# Patient Record
Sex: Female | Born: 1970
Health system: Southern US, Community
[De-identification: ages and names within clinical notes are randomized; demographics above are authoritative.]

## PROBLEM LIST (undated history)

## (undated) DIAGNOSIS — K5909 Other constipation: Secondary | ICD-10-CM

## (undated) DIAGNOSIS — E119 Type 2 diabetes mellitus without complications: Secondary | ICD-10-CM

## (undated) DIAGNOSIS — I10 Essential (primary) hypertension: Secondary | ICD-10-CM

## (undated) DIAGNOSIS — K219 Gastro-esophageal reflux disease without esophagitis: Secondary | ICD-10-CM

## (undated) DIAGNOSIS — K2970 Gastritis, unspecified, without bleeding: Secondary | ICD-10-CM

## (undated) DIAGNOSIS — F329 Major depressive disorder, single episode, unspecified: Secondary | ICD-10-CM

## (undated) DIAGNOSIS — F32A Depression, unspecified: Secondary | ICD-10-CM

---

## 2006-03-14 ENCOUNTER — Ambulatory Visit: Payer: Self-pay | Admitting: Family Medicine

## 2006-04-07 ENCOUNTER — Ambulatory Visit: Payer: Self-pay | Admitting: Family Medicine

## 2006-05-10 ENCOUNTER — Ambulatory Visit: Payer: Self-pay | Admitting: Family Medicine

## 2006-05-17 ENCOUNTER — Ambulatory Visit: Payer: Self-pay | Admitting: Family Medicine

## 2011-11-03 ENCOUNTER — Emergency Department: Payer: Self-pay | Admitting: Emergency Medicine

## 2012-06-29 DIAGNOSIS — F209 Schizophrenia, unspecified: Secondary | ICD-10-CM | POA: Insufficient documentation

## 2012-06-29 DIAGNOSIS — I10 Essential (primary) hypertension: Secondary | ICD-10-CM | POA: Insufficient documentation

## 2012-06-29 DIAGNOSIS — H903 Sensorineural hearing loss, bilateral: Secondary | ICD-10-CM | POA: Insufficient documentation

## 2012-09-29 ENCOUNTER — Ambulatory Visit: Payer: Self-pay | Admitting: Registered Nurse

## 2012-10-12 ENCOUNTER — Ambulatory Visit: Payer: Self-pay | Admitting: Registered Nurse

## 2013-07-24 ENCOUNTER — Ambulatory Visit: Payer: Self-pay | Admitting: Internal Medicine

## 2013-10-18 ENCOUNTER — Ambulatory Visit: Payer: Self-pay | Admitting: Nurse Practitioner

## 2013-10-23 DIAGNOSIS — R059 Cough, unspecified: Secondary | ICD-10-CM | POA: Insufficient documentation

## 2013-10-23 DIAGNOSIS — R05 Cough: Secondary | ICD-10-CM | POA: Insufficient documentation

## 2013-10-23 DIAGNOSIS — Z720 Tobacco use: Secondary | ICD-10-CM | POA: Insufficient documentation

## 2013-10-23 HISTORY — DX: Cough, unspecified: R05.9

## 2014-02-21 ENCOUNTER — Other Ambulatory Visit (HOSPITAL_COMMUNITY): Payer: Self-pay | Admitting: Internal Medicine

## 2014-02-21 DIAGNOSIS — Z1231 Encounter for screening mammogram for malignant neoplasm of breast: Secondary | ICD-10-CM

## 2014-03-04 ENCOUNTER — Ambulatory Visit (HOSPITAL_COMMUNITY): Payer: Self-pay

## 2014-05-17 DIAGNOSIS — H9193 Unspecified hearing loss, bilateral: Secondary | ICD-10-CM | POA: Insufficient documentation

## 2014-05-17 DIAGNOSIS — E119 Type 2 diabetes mellitus without complications: Secondary | ICD-10-CM | POA: Insufficient documentation

## 2014-05-17 DIAGNOSIS — E1165 Type 2 diabetes mellitus with hyperglycemia: Secondary | ICD-10-CM | POA: Insufficient documentation

## 2014-05-17 DIAGNOSIS — Z794 Long term (current) use of insulin: Secondary | ICD-10-CM | POA: Insufficient documentation

## 2014-06-25 ENCOUNTER — Ambulatory Visit: Payer: Self-pay | Admitting: Gastroenterology

## 2014-08-05 LAB — SURGICAL PATHOLOGY

## 2014-09-05 ENCOUNTER — Encounter: Payer: Self-pay | Admitting: *Deleted

## 2014-09-06 ENCOUNTER — Encounter
Admission: RE | Disposition: A | Discharge status: Home / Self Care | Payer: Self-pay | Source: Ambulatory Visit | Attending: Gastroenterology

## 2014-09-06 ENCOUNTER — Ambulatory Visit: Payer: Medicare Other | Admitting: Anesthesiology

## 2014-09-06 ENCOUNTER — Ambulatory Visit
Admission: RE | Admit: 2014-09-06 | Discharge: 2014-09-06 | Disposition: A | Discharge status: Home / Self Care | Payer: Medicare Other | Source: Ambulatory Visit | Attending: Gastroenterology | Admitting: Gastroenterology

## 2014-09-06 DIAGNOSIS — K5900 Constipation, unspecified: Secondary | ICD-10-CM | POA: Insufficient documentation

## 2014-09-06 DIAGNOSIS — K625 Hemorrhage of anus and rectum: Secondary | ICD-10-CM | POA: Insufficient documentation

## 2014-09-06 DIAGNOSIS — E119 Type 2 diabetes mellitus without complications: Secondary | ICD-10-CM | POA: Diagnosis not present

## 2014-09-06 DIAGNOSIS — F17210 Nicotine dependence, cigarettes, uncomplicated: Secondary | ICD-10-CM | POA: Diagnosis not present

## 2014-09-06 DIAGNOSIS — Z79899 Other long term (current) drug therapy: Secondary | ICD-10-CM | POA: Insufficient documentation

## 2014-09-06 DIAGNOSIS — I10 Essential (primary) hypertension: Secondary | ICD-10-CM | POA: Diagnosis not present

## 2014-09-06 DIAGNOSIS — F329 Major depressive disorder, single episode, unspecified: Secondary | ICD-10-CM | POA: Insufficient documentation

## 2014-09-06 DIAGNOSIS — K219 Gastro-esophageal reflux disease without esophagitis: Secondary | ICD-10-CM | POA: Insufficient documentation

## 2014-09-06 HISTORY — DX: Major depressive disorder, single episode, unspecified: F32.9

## 2014-09-06 HISTORY — DX: Essential (primary) hypertension: I10

## 2014-09-06 HISTORY — DX: Gastro-esophageal reflux disease without esophagitis: K21.9

## 2014-09-06 HISTORY — DX: Gastritis, unspecified, without bleeding: K29.70

## 2014-09-06 HISTORY — DX: Depression, unspecified: F32.A

## 2014-09-06 HISTORY — PX: COLONOSCOPY: SHX5424

## 2014-09-06 HISTORY — DX: Type 2 diabetes mellitus without complications: E11.9

## 2014-09-06 HISTORY — DX: Other constipation: K59.09

## 2014-09-06 LAB — GLUCOSE, CAPILLARY: Glucose-Capillary: 133 mg/dL — ABNORMAL HIGH (ref 65–99)

## 2014-09-06 LAB — POCT PREGNANCY, URINE: Preg Test, Ur: NEGATIVE

## 2014-09-06 SURGERY — COLONOSCOPY
Anesthesia: General

## 2014-09-06 MED ORDER — PROPOFOL INFUSION 10 MG/ML OPTIME
INTRAVENOUS | Status: DC | PRN
  Administered 2014-09-06: 140 ug/kg/min via INTRAVENOUS

## 2014-09-06 MED ORDER — MIDAZOLAM HCL 5 MG/5ML IJ SOLN
INTRAMUSCULAR | Status: DC | PRN
  Administered 2014-09-06: 2 mg via INTRAVENOUS

## 2014-09-06 MED ORDER — PROPOFOL 10 MG/ML IV BOLUS
INTRAVENOUS | Status: DC | PRN
  Administered 2014-09-06: 70 mg via INTRAVENOUS

## 2014-09-06 MED ORDER — FENTANYL CITRATE (PF) 100 MCG/2ML IJ SOLN
INTRAMUSCULAR | Status: DC | PRN
  Administered 2014-09-06: 50 ug via INTRAVENOUS

## 2014-09-06 MED ORDER — SODIUM CHLORIDE 0.9 % IV SOLN
INTRAVENOUS | Status: DC
  Administered 2014-09-06: 09:00:00 via INTRAVENOUS
  Administered 2014-09-06: 1000 mL via INTRAVENOUS

## 2014-09-06 NOTE — Anesthesia Preprocedure Evaluation (Signed)
Anesthesia Evaluation  Patient identified by MRN, date of birth, ID band Patient awake    Reviewed: Allergy & Precautions, H&P , NPO status , Patient's Chart, lab work & pertinent test results, reviewed documented beta blocker date and time   Airway Mallampati: II  TM Distance: >3 FB Neck ROM: full    Dental no notable dental hx.    Pulmonary neg pulmonary ROS, Current Smoker,  breath sounds clear to auscultation  Pulmonary exam normal       Cardiovascular Exercise Tolerance: Good hypertension, negative cardio ROS  Rhythm:regular Rate:Normal     Neuro/Psych PSYCHIATRIC DISORDERS negative neurological ROS  negative psych ROS   GI/Hepatic negative GI ROS, Neg liver ROS, GERD-  ,  Endo/Other  negative endocrine ROSdiabetes  Renal/GU negative Renal ROS  negative genitourinary   Musculoskeletal   Abdominal   Peds  Hematology negative hematology ROS (+)   Anesthesia Other Findings   Reproductive/Obstetrics negative OB ROS                             Anesthesia Physical Anesthesia Plan  ASA: III  Anesthesia Plan: General   Post-op Pain Management:    Induction:   Airway Management Planned:   Additional Equipment:   Intra-op Plan:   Post-operative Plan:   Informed Consent: I have reviewed the patients History and Physical, chart, labs and discussed the procedure including the risks, benefits and alternatives for the proposed anesthesia with the patient or authorized representative who has indicated his/her understanding and acceptance.   Dental Advisory Given  Plan Discussed with: CRNA  Anesthesia Plan Comments:         Anesthesia Quick Evaluation

## 2014-09-06 NOTE — H&P (Signed)
Primary Care Physician:  [name], NP  Pre-Procedure History & Physical: HPI:  Traci Pena is a 44 y.o. female is here for an colonoscopy.   Past Medical History  Diagnosis Date  . Hypertension   . Diabetes mellitus without complication   . Gastritis   . Depression   . GERD (gastroesophageal reflux disease)   . Chronic constipation     History reviewed. No pertinent past surgical history.  Prior to Admission medications   Medication Sig Start Date End Date Taking? Authorizing Provider  albuterol (PROVENTIL HFA;VENTOLIN HFA) 108 (90 BASE) MCG/ACT inhaler Inhale 2 puffs into the lungs every 6 (six) hours as needed for wheezing or shortness of breath.   Yes Historical Provider, MD  aspirin EC 81 MG tablet Take 81 mg by mouth daily.   Yes Historical Provider, MD  atenolol (TENORMIN) 25 MG tablet Take by mouth daily.   Yes Historical Provider, MD  budesonide-formoterol (SYMBICORT) 160-4.5 MCG/ACT inhaler Inhale 2 puffs into the lungs 2 (two) times daily.   Yes Historical Provider, MD  cetirizine (ZYRTEC) 10 MG tablet Take 10 mg by mouth daily.   Yes Historical Provider, MD  cloZAPine (CLOZARIL) 100 MG tablet Take 100 mg by mouth daily.   Yes Historical Provider, MD  fluticasone (FLONASE) 50 MCG/ACT nasal spray Place 1 spray into both nostrils daily.   Yes Historical Provider, MD  Insulin Glargine 300 UNIT/ML SOPN Take 1.5 mLs by mouth at bedtime.   Yes Historical Provider, MD  insulin lispro (HUMALOG) 100 UNIT/ML injection Inject into the skin 3 (three) times daily before meals.   Yes Historical Provider, MD  iron polysaccharides (NIFEREX) 150 MG capsule Take 150 mg by mouth daily.   Yes Historical Provider, MD  lisinopril (PRINIVIL,ZESTRIL) 10 MG tablet Take 5 mg by mouth daily.   Yes Historical Provider, MD  lithium 600 MG capsule Take 600 mg by mouth at bedtime.   Yes Historical Provider, MD  metFORMIN (GLUCOPHAGE) 1000 MG tablet Take 1,000 mg by mouth 2 (two) times daily with  a meal.   Yes Historical Provider, MD  montelukast (SINGULAIR) 10 MG tablet Take 10 mg by mouth at bedtime.   Yes Historical Provider, MD  Multiple Vitamin (MULTIVITAMIN) tablet Take 1 tablet by mouth daily.   Yes Historical Provider, MD  omeprazole (PRILOSEC) 40 MG capsule Take 40 mg by mouth daily.   Yes Historical Provider, MD  polyethylene glycol (MIRALAX / GLYCOLAX) packet Take 17 g by mouth daily as needed.   Yes Historical Provider, MD  sertraline (ZOLOFT) 50 MG tablet Take 50 mg by mouth daily.   Yes Historical Provider, MD  tiotropium (SPIRIVA) 18 MCG inhalation capsule Place 18 mcg into inhaler and inhale daily.   Yes Historical Provider, MD    Allergies as of 08/16/2014  . (Not on File)    History reviewed. No pertinent family history.  History   Social History  . Marital Status: Single    Spouse Name: N/A  . Number of Children: N/A  . Years of Education: N/A   Occupational History  . Not on file.   Social History Main Topics  . Smoking status: Current Every Day Smoker  . Smokeless tobacco: Never Used  . Alcohol Use: No  . Drug Use: No  . Sexual Activity: Not on file   Other Topics Concern  . Not on file   Social History Narrative     Physical Exam: BP 142/73 mmHg  Pulse 103  Temp(Src) 98.4 F (  36.9 C) (Oral)  Resp 17  Ht 5\' 4"  (1.626 m)  Wt 200 lb (90.719 kg)  BMI 34.31 kg/m2  SpO2 100% General:   Alert,  pleasant and cooperative in NAD Head:  Normocephalic and atraumatic. Neck:  Supple; no masses or thyromegaly. Lungs:  Clear throughout to auscultation.    Heart:  Regular rate and rhythm. Abdomen:  Soft, nontender and nondistended. Normal bowel sounds, without guarding, and without rebound.   Neurologic:  Alert and  oriented x4;  grossly normal neurologically.  Impression/Plan: Traci Pena is here for an colonoscopy to be performed for rectal bleeding  Risks, benefits, limitations, and alternatives regarding  colonoscopy have been reviewed  with the patient.  Questions have been answered.  All parties agreeable.   Elnita MaxwellEIN, Tayona Sarnowski GORDON, MD  09/06/2014, 8:44 AM

## 2014-09-06 NOTE — Discharge Instructions (Signed)

## 2014-09-06 NOTE — Transfer of Care (Signed)
Immediate Anesthesia Transfer of Care Note  Patient: Traci Pena  Procedure(s) Performed: Procedure(s): COLONOSCOPY (N/A)  Patient Location: PACU  Anesthesia Type:MAC  Level of Consciousness: sedated  Airway & Oxygen Therapy: Patient Spontanous Breathing and Patient connected to nasal cannula oxygen  Post-op Assessment: Report given to RN and Post -op Vital signs reviewed and stable  Post vital signs: Reviewed and stable  Last Vitals:  Filed Vitals:   09/06/14 0817  BP: 142/73  Pulse: 103  Temp:   Resp: 17    Complications: No apparent anesthesia complications

## 2014-09-06 NOTE — Anesthesia Postprocedure Evaluation (Signed)
  Anesthesia Post-op Note  Patient: Traci Pena  Procedure(s) Performed: Procedure(s): COLONOSCOPY (N/A)  Anesthesia type:General  Patient location: PACU  Post pain: Pain level controlled  Post assessment: Post-op Vital signs reviewed, Patient's Cardiovascular Status Stable, Respiratory Function Stable, Patent Airway and No signs of Nausea or vomiting  Post vital signs: Reviewed and stable  Last Vitals:  Filed Vitals:   09/06/14 1000  BP: 88/76  Pulse: 67  Temp:   Resp: 19    Level of consciousness: awake, alert  and patient cooperative  Complications: No apparent anesthesia complications

## 2014-09-06 NOTE — Op Note (Signed)
[facility] Patient Name: Traci Pena Procedure Date: 09/06/2014 8:52 AM MRN: [id number] Account #: [id number] Date of Birth: 1971-04-01 Admit Type: Outpatient Age: 44 Room: [facility] ENDO ROOM 2 Gender: Female Note Status: Finalized Procedure:         Colonoscopy Indications:       Rectal bleeding Patient Profile:   This is a 44 year old female. Providers:         Rhona Raider. Shelle Iron, MD Referring MD:      Resa Miner Marcelle Overlie (Referring MD) Medicines:         Propofol per Anesthesia Complications:     No immediate complications. Procedure:         Pre-Anesthesia Assessment:                    - Prior to the procedure, a History and Physical was                     performed, and patient medications and allergies were                     reviewed. The patient is competent. The risks and benefits                     of the procedure and the sedation options and risks were                     discussed with the patient. All questions were answered                     and informed consent was obtained. Patient identification                     and proposed procedure were verified by the physician and                     the nurse in the pre-procedure area. Mental Status                     Examination: alert and oriented. Airway Examination:                     normal oropharyngeal airway and neck mobility. Respiratory                     Examination: clear to auscultation. CV Examination: RRR,                     no murmurs, no S3 or S4. Prophylactic Antibiotics: The                     patient does not require prophylactic antibiotics. Prior                     Anticoagulants: The patient has taken no previous                     anticoagulant or antiplatelet agents. ASA Grade                     Assessment: II - A patient with mild systemic disease.                     After reviewing the risks and benefits, the patient was  deemed in  satisfactory condition to undergo the procedure.                     The anesthesia plan was to use monitored anesthesia care                     (MAC). Immediately prior to administration of medications,                     the patient was re-assessed for adequacy to receive                     sedatives. The heart rate, respiratory rate, oxygen                     saturations, blood pressure, adequacy of pulmonary                     ventilation, and response to care were monitored                     throughout the procedure. The physical status of the                     patient was re-assessed after the procedure.                    - Prior to the procedure, a History and Physical was                     performed, and patient medications, allergies and                     sensitivities were reviewed. The patient's tolerance of                     previous anesthesia was reviewed.                    After obtaining informed consent, the colonoscope was                     passed under direct vision. Throughout the procedure, the                     patient's blood pressure, pulse, and oxygen saturations                     were monitored continuously. The Olympus CF-Q160AL                     colonoscope (S#. [id number]) was introduced through the anus                     and advanced to the the terminal ileum. The colonoscopy                     was performed without difficulty. The patient tolerated                     the procedure well. The quality of the bowel preparation                     was evaluated using the BBPS [facility] Bowel Preparation  Scale) with scores of: Right Colon = 2 (minor amount of                     residual staining, small fragments of stool and/or opaque                     liquid, but mucosa seen well), Transverse Colon = 1                     (portion of mucosa seen, but other areas not well seen due                     to staining,  residual stool and/or opaque liquid) and Left                     Colon = 1 (portion of mucosa seen, but other areas not                     well seen due to staining, residual stool and/or opaque                     liquid). The total BBPS score equals 4. The patient                     tolerated the procedure well. Findings:      The perianal and digital rectal examinations were normal.      The entire examined colon appeared normal on direct and retroflexion       views.      The terminal ileum appeared normal. Impression:        - The entire examined colon is normal on direct and                     retroflexion views.                    - No specimens collected.                    - Suspect blood was coming from vagina, not rectum. Recommendation:    - Observe patient in GI recovery unit.                    - High fiber diet.                    - Continue present medications.                    - Repeat colonoscopy in 10 years for screening purposes.                    - Return to referring physician.                    - The findings and recommendations were discussed with the                     patient.                    - The findings and recommendations were discussed with the                     patient's family. Procedure Code(s): --- Professional ---  [id number], Colonoscopy, flexible; diagnostic, including                     collection of specimen(s) by brushing or washing, when                     performed (separate procedure) CPT copyright 2014 American Medical Association. All rights reserved. The codes documented in this report are preliminary and upon coder review may  be revised to meet current compliance requirements. Kathalene FramesMatthew G Rein, MD 09/06/2014 9:28:52 AM This report has been signed electronically. Number of Addenda: 0 Note Initiated On: 09/06/2014 8:52 AM Scope Withdrawal Time: 0 hours 13 minutes 50 seconds  Total Procedure Duration: 0 hours  21 minutes 48 seconds       [facility]

## 2014-09-10 ENCOUNTER — Encounter: Payer: Self-pay | Admitting: Gastroenterology

## 2014-12-06 ENCOUNTER — Inpatient Hospital Stay: Payer: Medicare Other | Attending: Oncology | Admitting: Oncology

## 2015-01-02 ENCOUNTER — Ambulatory Visit: Payer: Medicare Other | Admitting: Cardiothoracic Surgery

## 2015-01-09 ENCOUNTER — Inpatient Hospital Stay: Payer: Medicare Other | Admitting: Cardiothoracic Surgery

## 2015-01-09 ENCOUNTER — Ambulatory Visit: Payer: Medicare Other | Admitting: Cardiothoracic Surgery

## 2015-01-14 ENCOUNTER — Other Ambulatory Visit: Payer: Self-pay | Admitting: Cardiothoracic Surgery

## 2015-01-14 ENCOUNTER — Inpatient Hospital Stay
Admission: RE | Admit: 2015-01-14 | Discharge: 2015-01-14 | Disposition: A | Discharge status: Home / Self Care | Payer: Self-pay | Source: Ambulatory Visit | Attending: Cardiothoracic Surgery | Admitting: Cardiothoracic Surgery

## 2015-01-14 DIAGNOSIS — X58XXXA Exposure to other specified factors, initial encounter: Secondary | ICD-10-CM

## 2015-01-16 ENCOUNTER — Ambulatory Visit: Payer: Medicare Other | Admitting: Cardiothoracic Surgery

## 2015-01-16 ENCOUNTER — Encounter: Payer: Self-pay | Admitting: Cardiothoracic Surgery

## 2015-01-16 ENCOUNTER — Inpatient Hospital Stay: Payer: Medicare Other | Attending: Cardiothoracic Surgery | Admitting: Cardiothoracic Surgery

## 2015-01-16 VITALS — BP 109/71 | HR 86 | Temp 96.3°F | Resp 18 | Ht 64.0 in | Wt 200.6 lb

## 2015-01-16 DIAGNOSIS — F329 Major depressive disorder, single episode, unspecified: Secondary | ICD-10-CM | POA: Diagnosis not present

## 2015-01-16 DIAGNOSIS — R918 Other nonspecific abnormal finding of lung field: Secondary | ICD-10-CM | POA: Insufficient documentation

## 2015-01-16 DIAGNOSIS — K219 Gastro-esophageal reflux disease without esophagitis: Secondary | ICD-10-CM | POA: Diagnosis not present

## 2015-01-16 DIAGNOSIS — I10 Essential (primary) hypertension: Secondary | ICD-10-CM | POA: Diagnosis not present

## 2015-01-16 DIAGNOSIS — R5383 Other fatigue: Secondary | ICD-10-CM | POA: Diagnosis not present

## 2015-01-16 DIAGNOSIS — N920 Excessive and frequent menstruation with regular cycle: Secondary | ICD-10-CM | POA: Insufficient documentation

## 2015-01-16 DIAGNOSIS — Z7982 Long term (current) use of aspirin: Secondary | ICD-10-CM | POA: Insufficient documentation

## 2015-01-16 DIAGNOSIS — R911 Solitary pulmonary nodule: Secondary | ICD-10-CM

## 2015-01-16 DIAGNOSIS — F17210 Nicotine dependence, cigarettes, uncomplicated: Secondary | ICD-10-CM | POA: Insufficient documentation

## 2015-01-16 DIAGNOSIS — Z794 Long term (current) use of insulin: Secondary | ICD-10-CM | POA: Diagnosis not present

## 2015-01-16 DIAGNOSIS — Z79899 Other long term (current) drug therapy: Secondary | ICD-10-CM | POA: Insufficient documentation

## 2015-01-16 DIAGNOSIS — D500 Iron deficiency anemia secondary to blood loss (chronic): Secondary | ICD-10-CM | POA: Diagnosis not present

## 2015-01-16 DIAGNOSIS — K5900 Constipation, unspecified: Secondary | ICD-10-CM | POA: Diagnosis not present

## 2015-01-16 DIAGNOSIS — E119 Type 2 diabetes mellitus without complications: Secondary | ICD-10-CM | POA: Diagnosis not present

## 2015-01-16 NOTE — Progress Notes (Signed)
Patient is referred here by [name] for lung nodule found on CT Scan. Patient states that she has been smoking for 3 years and smokes about 4 packs per day. She states that she "enjoys smoking". She denies any pain. She states that she does have some off and on SOB.

## 2015-01-16 NOTE — Progress Notes (Signed)
Patient ID: Traci Pena, female   DOB: [birth year], 44 y.o.   MRN: [id number]  Chief Complaint  Patient presents with  . Lung Lesion    Referral- Chelsa Holland    Referred By Dr. Rockney Ghee Reason for Referral right lung nodule  HPI Location, Quality, Duration, Severity, Timing, Context, Modifying Factors, Associated Signs and Symptoms.  Traci Pena is a 44 y.o. female.  This patient is a 44 year old African-American female who had a CT scan performed in February of this year. The history is obtained by way of an interpreter as the patient is deaf. The patient is unaware of why she had the CT scan made but it did reveal several small nodules in the lung. She has a heavy smoking history of at least 5 packs per day. She states she does not get short of breath unless she is undergoing significant exertion. She's had no weight loss fevers or chills. She is unaware of the delay in referral and there are no other x-rays for my review.   Past Medical History  Diagnosis Date  . Hypertension   . Diabetes mellitus without complication (HCC)   . Gastritis   . Depression   . GERD (gastroesophageal reflux disease)   . Chronic constipation     Past Surgical History  Procedure Laterality Date  . Colonoscopy N/A 09/06/2014    Procedure: COLONOSCOPY;  Surgeon: Elnita Maxwell, MD;  Location: [facility] ENDOSCOPY;  Service: Endoscopy;  Laterality: N/A;    History reviewed. No pertinent family history.  Social History Social History  Substance Use Topics  . Smoking status: Current Every Day Smoker -- 4.00 packs/day for 3 years    Types: Cigarettes  . Smokeless tobacco: Never Used  . Alcohol Use: No    No Known Allergies  Current Outpatient Prescriptions  Medication Sig Dispense Refill  . albuterol (PROVENTIL HFA;VENTOLIN HFA) 108 (90 BASE) MCG/ACT inhaler Inhale 2 puffs into the lungs every 6 (six) hours as needed for wheezing or shortness of breath.    Marland Kitchen aspirin EC 81 MG tablet  Take 81 mg by mouth daily.    Marland Kitchen atenolol (TENORMIN) 25 MG tablet Take by mouth daily.    . budesonide-formoterol (SYMBICORT) 160-4.5 MCG/ACT inhaler Inhale 2 puffs into the lungs 2 (two) times daily.    . cetirizine (ZYRTEC) 10 MG tablet Take 10 mg by mouth daily.    . cloZAPine (CLOZARIL) 100 MG tablet Take 100 mg by mouth daily.    . fluticasone (FLONASE) 50 MCG/ACT nasal spray Place 1 spray into both nostrils daily.    . Insulin Glargine 300 UNIT/ML SOPN Take 1.5 mLs by mouth at bedtime.    . insulin lispro (HUMALOG) 100 UNIT/ML injection Inject into the skin 3 (three) times daily before meals.    . iron polysaccharides (NIFEREX) 150 MG capsule Take 150 mg by mouth daily.    Marland Kitchen lisinopril (PRINIVIL,ZESTRIL) 10 MG tablet Take 5 mg by mouth daily.    Marland Kitchen lithium 600 MG capsule Take 600 mg by mouth at bedtime.    . metFORMIN (GLUCOPHAGE) 1000 MG tablet Take 1,000 mg by mouth 2 (two) times daily with a meal.    . montelukast (SINGULAIR) 10 MG tablet Take 10 mg by mouth at bedtime.    . Multiple Vitamin (MULTIVITAMIN) tablet Take 1 tablet by mouth daily.    Marland Kitchen omeprazole (PRILOSEC) 40 MG capsule Take 40 mg by mouth daily.    . polyethylene glycol (MIRALAX / GLYCOLAX) packet Take 17 g  by mouth daily as needed.    . sertraline (ZOLOFT) 50 MG tablet Take 50 mg by mouth daily.    Marland Kitchen tiotropium (SPIRIVA) 18 MCG inhalation capsule Place 18 mcg into inhaler and inhale daily.     No current facility-administered medications for this visit.      Review of Systems A complete review of systems was asked and was negative except for the following positive findings difficulty with vision, cough, coughing up blood, shortness of breath, excessive urination, easy bruising, food allergies.  Blood pressure 109/71, pulse 86, temperature 96.3 F (35.7 C), temperature source Tympanic, resp. rate 18, height  (1.626 m), weight 200 lb 9.9 oz (91 kg).  Physical Exam CONSTITUTIONAL:  Pleasant, well-developed,  well-nourished, and in no acute distress. EYES: Pupils equal and reactive to light, Sclera non-icteric EARS, NOSE, MOUTH AND THROAT:  The oropharynx was clear.  Dentition is in poor repair.  Oral mucosa pink and moist. LYMPH NODES:  Lymph nodes in the neck and axillae were normal RESPIRATORY:  Lungs were clear with occasional wheeze.  Normal respiratory effort without pathologic use of accessory muscles of respiration CARDIOVASCULAR: Heart was regular without murmurs.  There were no carotid bruits. GI: The abdomen was soft, nontender, and nondistended. There were no palpable masses. There was no hepatosplenomegaly. There were normal bowel sounds in all quadrants. GU:  Rectal deferred.   MUSCULOSKELETAL:  Normal muscle strength and tone.  No clubbing or cyanosis.   SKIN:  There were no pathologic skin lesions.  There were no nodules on palpation. NEUROLOGIC:  Sensation is normal.  Cranial nerves are grossly intact. PSYCH:  Oriented to person, place and time.  Mood and affect are normal.  Data Reviewed CT scan of the chest dated February 2016  I have personally reviewed the patient's imaging, laboratory findings and medical records.    Assessment    I have independently reviewed the CT scan of the chest. This shows several small pulmonary nodules in the right lung. Since it's been over 6 months since her last CT and there've been no intervening radiology imaging I would recommend that we repeat the CT scan at this time. We'll go ahead and set her up for that in see her back at the same time.    Plan    Repeat CT scan the chest. Follow-up after scan. Smoking cessation was strongly encouraged. She seemed to have little desire to stop.       Hulda Marin, MD 01/16/2015, 11:47 AM

## 2015-01-17 ENCOUNTER — Ambulatory Visit: Payer: Medicare Other | Admitting: Cardiothoracic Surgery

## 2015-01-24 ENCOUNTER — Ambulatory Visit
Admission: RE | Admit: 2015-01-24 | Discharge: 2015-01-24 | Disposition: A | Discharge status: Home / Self Care | Payer: Medicare Other | Source: Ambulatory Visit | Attending: Cardiothoracic Surgery | Admitting: Cardiothoracic Surgery

## 2015-01-24 ENCOUNTER — Inpatient Hospital Stay (HOSPITAL_BASED_OUTPATIENT_CLINIC_OR_DEPARTMENT_OTHER): Payer: Medicare Other | Admitting: Cardiothoracic Surgery

## 2015-01-24 VITALS — BP 107/76 | HR 81 | Temp 95.9°F | Resp 18 | Ht 64.0 in | Wt 203.7 lb

## 2015-01-24 DIAGNOSIS — R911 Solitary pulmonary nodule: Secondary | ICD-10-CM

## 2015-01-24 DIAGNOSIS — R918 Other nonspecific abnormal finding of lung field: Secondary | ICD-10-CM | POA: Diagnosis not present

## 2015-01-24 NOTE — Progress Notes (Addendum)
Traci Pena Inpatient Post-Op Note  Patient ID: Traci Pena, female   DOB: 12/26/1970, 44 y.o.   MRN: [id number]  HISTORY: She presents today without an interpreter however the caregiver that she is with is able to sign some and we are able to communicate through her. The patient continues to smoke. She does not have any complaints today.   Filed Vitals:   01/24/15 1004  BP: 107/76  Pulse: 81  Temp: 95.9 F (35.5 C)  Resp: 18     EXAM: Resp: Lungs are clear bilaterally.  No respiratory distress, normal effort. Heart:  Regular without murmurs Skin: Skin is warm and dry. No rash noted. No diaphoretic. No erythema. No pallor.  Psychiatric: Normal mood and affect. Normal behavior.   ASSESSMENT: She did have a chest CT scan made. I did independently review that. There are some scattered pulmonary nodules that do not appear particularly suspicious. There were several areas in the mid thoracic spine that the radiologist recommended a repeat CT in 3 months. I again counseled the patient on tobacco cessation.   PLAN:   We will get another CT scan in 3 months. I will see her again at that time.    Hulda Marinimothy Garrus Gauthreaux, MD

## 2015-01-24 NOTE — Progress Notes (Signed)
Patient is here for follow-up of CT Scan results.

## 2015-01-24 NOTE — Addendum Note (Signed)
Addended by: Hulda MarinAKS, Kamden Reber E on: 01/24/2015 10:39 AM   Modules accepted: Level of Service

## 2015-02-07 ENCOUNTER — Other Ambulatory Visit: Payer: Self-pay | Admitting: Internal Medicine

## 2015-02-07 ENCOUNTER — Inpatient Hospital Stay (HOSPITAL_BASED_OUTPATIENT_CLINIC_OR_DEPARTMENT_OTHER): Payer: Medicare Other | Admitting: Internal Medicine

## 2015-02-07 ENCOUNTER — Inpatient Hospital Stay: Payer: Medicare Other

## 2015-02-07 VITALS — BP 113/72 | HR 78 | Temp 97.8°F | Wt 202.6 lb

## 2015-02-07 DIAGNOSIS — D500 Iron deficiency anemia secondary to blood loss (chronic): Secondary | ICD-10-CM | POA: Diagnosis not present

## 2015-02-07 DIAGNOSIS — I10 Essential (primary) hypertension: Secondary | ICD-10-CM

## 2015-02-07 DIAGNOSIS — Z794 Long term (current) use of insulin: Secondary | ICD-10-CM

## 2015-02-07 DIAGNOSIS — K5900 Constipation, unspecified: Secondary | ICD-10-CM

## 2015-02-07 DIAGNOSIS — R5383 Other fatigue: Secondary | ICD-10-CM | POA: Diagnosis not present

## 2015-02-07 DIAGNOSIS — Z79899 Other long term (current) drug therapy: Secondary | ICD-10-CM

## 2015-02-07 DIAGNOSIS — F329 Major depressive disorder, single episode, unspecified: Secondary | ICD-10-CM

## 2015-02-07 DIAGNOSIS — K219 Gastro-esophageal reflux disease without esophagitis: Secondary | ICD-10-CM

## 2015-02-07 DIAGNOSIS — E119 Type 2 diabetes mellitus without complications: Secondary | ICD-10-CM

## 2015-02-07 DIAGNOSIS — Z7982 Long term (current) use of aspirin: Secondary | ICD-10-CM

## 2015-02-07 DIAGNOSIS — N920 Excessive and frequent menstruation with regular cycle: Secondary | ICD-10-CM

## 2015-02-07 DIAGNOSIS — R918 Other nonspecific abnormal finding of lung field: Secondary | ICD-10-CM

## 2015-02-07 DIAGNOSIS — F17210 Nicotine dependence, cigarettes, uncomplicated: Secondary | ICD-10-CM

## 2015-02-07 LAB — CBC WITH DIFFERENTIAL/PLATELET
Basophils Absolute: 0.1 10*3/uL (ref 0–0.1)
Basophils Relative: 1 %
EOS ABS: 0.3 10*3/uL (ref 0–0.7)
EOS PCT: 3 %
HCT: 27.6 % — ABNORMAL LOW (ref 35.0–47.0)
Hemoglobin: 8.3 g/dL — ABNORMAL LOW (ref 12.0–16.0)
LYMPHS ABS: 3.4 10*3/uL (ref 1.0–3.6)
LYMPHS PCT: 34 %
MCH: 19 pg — AB (ref 26.0–34.0)
MCHC: 30.1 g/dL — AB (ref 32.0–36.0)
MCV: 63.3 fL — AB (ref 80.0–100.0)
MONO ABS: 0.5 10*3/uL (ref 0.2–0.9)
Monocytes Relative: 5 %
Neutro Abs: 5.8 10*3/uL (ref 1.4–6.5)
Neutrophils Relative %: 57 %
PLATELETS: 571 10*3/uL — AB (ref 150–440)
RBC: 4.36 MIL/uL (ref 3.80–5.20)
RDW: 18.7 % — AB (ref 11.5–14.5)
WBC: 10.2 10*3/uL (ref 3.6–11.0)

## 2015-02-07 LAB — FERRITIN: Ferritin: 5 ng/mL — ABNORMAL LOW (ref 11–307)

## 2015-02-07 LAB — IRON AND TIBC
Iron: 18 ug/dL — ABNORMAL LOW (ref 28–170)
SATURATION RATIOS: 4 % — AB (ref 10.4–31.8)
TIBC: 407 ug/dL (ref 250–450)
UIBC: 389 ug/dL

## 2015-02-07 LAB — LACTATE DEHYDROGENASE: LDH: 104 U/L (ref 98–192)

## 2015-02-07 NOTE — Progress Notes (Signed)
[facility] CONSULT NOTE  Patient Care Team: Imelda Pillowhelsa Holland, NP as PCP - General (Nurse Practitioner)  CHIEF COMPLAINTS/PURPOSE OF CONSULTATION:    # OCT 2016- BIL Lung Lower lobes hazies- follow up CT in 3 m  # IDA sec menorrhagia [EGD/Colo-NEG; May 2016; Dr.Rein]  HISTORY OF PRESENTING ILLNESS:  Traci Pena 44 y.o.  female who has difficulty hearing/communication through an interpreter online. Patient has long-standing history of smoking; noted to have incidental bilateral lower lung hazy infiltrates; for which she is being evaluated by thoracic surgery.  She also has a history of heavy menstrual periods; she is on Depo-Provera shot. She complains of fatigue especially exertion. No nausea or vomiting. No rectal bleeding or GI bleeding. Patient had a recent endoscopy- EGD colonoscopy negative.  ROS: A complete 10 point review of system is done which is negative except mentioned above in history of present illness  MEDICAL HISTORY:  Past Medical History  Diagnosis Date  . Hypertension   . Diabetes mellitus without complication (HCC)   . Gastritis   . Depression   . GERD (gastroesophageal reflux disease)   . Chronic constipation     SURGICAL HISTORY: Past Surgical History  Procedure Laterality Date  . Colonoscopy N/A 09/06/2014    Procedure: COLONOSCOPY;  Surgeon: Elnita MaxwellMatthew Gordon Rein, MD;  [facility];  Service: Endoscopy;  Laterality: N/A;    SOCIAL HISTORY: Social History   Social History  . Marital Status: Single    Spouse Name: N/A  . Number of Children: N/A  . Years of Education: N/A   Occupational History  . Not on file.   Social History Main Topics  . Smoking status: Current Every Day Smoker -- 4.00 packs/day for 3 years    Types: Cigarettes  . Smokeless tobacco: Never Used  . Alcohol Use: No  . Drug Use: No  . Sexual Activity: No   Other Topics Concern  . Not on file   Social History Narrative    FAMILY HISTORY: No  family history on file.  ALLERGIES:  has No Known Allergies.  MEDICATIONS:  Current Outpatient Prescriptions  Medication Sig Dispense Refill  . aspirin EC 81 MG tablet Take 81 mg by mouth daily.    Marland Kitchen. atenolol (TENORMIN) 25 MG tablet Take by mouth daily.    . benztropine (COGENTIN) 1 MG tablet Take 1 mg by mouth 2 (two) times daily.    . budesonide-formoterol (SYMBICORT) 160-4.5 MCG/ACT inhaler Inhale 2 puffs into the lungs 2 (two) times daily.    . cetirizine (ZYRTEC) 10 MG tablet Take 10 mg by mouth daily.    . cloZAPine (CLOZARIL) 100 MG tablet Take 100 mg by mouth daily.    . fluticasone (FLONASE) 50 MCG/ACT nasal spray Place 1 spray into both nostrils daily.    . Insulin Glargine (TOUJEO SOLOSTAR) 300 UNIT/ML SOPN Inject into the skin.    Marland Kitchen. insulin lispro (HUMALOG) 100 UNIT/ML KiwkPen Inject into the skin.    Marland Kitchen. iron polysaccharides (NIFEREX) 150 MG capsule Take 150 mg by mouth daily.    Marland Kitchen. lisinopril (PRINIVIL,ZESTRIL) 10 MG tablet Take 5 mg by mouth daily.    Marland Kitchen. lithium 600 MG capsule Take 600 mg by mouth at bedtime.    . metFORMIN (GLUCOPHAGE) 1000 MG tablet Take 1,000 mg by mouth 2 (two) times daily with a meal.    . montelukast (SINGULAIR) 10 MG tablet Take 10 mg by mouth at bedtime.    . Multiple Vitamin (MULTIVITAMIN) tablet Take 1  tablet by mouth daily.    Marland Kitchen omeprazole (PRILOSEC) 40 MG capsule Take 40 mg by mouth daily.    . polyethylene glycol (MIRALAX / GLYCOLAX) packet Take 17 g by mouth daily as needed.    . sertraline (ZOLOFT) 50 MG tablet Take 50 mg by mouth daily.    Marland Kitchen tiotropium (SPIRIVA) 18 MCG inhalation capsule Place 18 mcg into inhaler and inhale daily.    Marland Kitchen albuterol (PROVENTIL HFA;VENTOLIN HFA) 108 (90 BASE) MCG/ACT inhaler Inhale 2 puffs into the lungs every 6 (six) hours as needed for wheezing or shortness of breath.     No current facility-administered medications for this visit.      Marland Kitchen  PHYSICAL EXAMINATION: ECOG PERFORMANCE STATUS: 0 -  Asymptomatic  Filed Vitals:   02/07/15 1013  BP: 113/72  Pulse: 78  Temp: 97.8 F (36.6 C)   Filed Weights   02/07/15 1013  Weight: 202 lb 9.6 oz (91.9 kg)    GENERAL: Well-nourished well-developed; Alert, no distress and comfortable.  She is accompanied by caregiver. Given the patient's difficulty hearing/communication through interpreter. EYES: no pallor or icterus OROPHARYNX: no thrush or ulceration; good dentition  NECK: supple, no masses felt LYMPH:  no palpable lymphadenopathy in the cervical, axillary or inguinal regions LUNGS: clear to auscultation and  No wheeze or crackles HEART/CVS: regular rate & rhythm and no murmurs; No lower extremity edema ABDOMEN: abdomen soft, non-tender and normal bowel sounds Musculoskeletal:no cyanosis of digits and no clubbing  PSYCH: alert & oriented x 3.  NEURO: no focal motor deficits. Patient is deaf.  SKIN:  no rashes or significant lesions  LABORATORY DATA:  I have reviewed the data as listed No results found for: WBC, HGB, HCT, MCV, PLT No results for input(s): NA, K, CL, CO2, GLUCOSE, BUN, CREATININE, CALCIUM, GFRNONAA, GFRAA, PROT, ALBUMIN, AST, ALT, ALKPHOS, BILITOT, BILIDIR, IBILI in the last 8760 hours.  RADIOGRAPHIC STUDIES: I have personally reviewed the radiological images as listed and agreed with the findings in the report. Ct Chest Wo Contrast  01/24/2015  CLINICAL DATA:  RIGHT lung nodules. EXAM: CT CHEST WITHOUT CONTRAST TECHNIQUE: Multidetector CT imaging of the chest was performed following the standard protocol without IV contrast. COMPARISON:  None. FINDINGS: Mediastinum/Nodes: Prominent bilateral axial lymph nodes. These lymph nodes measure less than 10 mm short axis. Example 9 mm node in the LEFT axilla image 10, series 2. No mediastinal hilar lymphadenopathy. No pericardial fluid. Esophagus normal. Lungs/Pleura: No discrete pulmonary nodules are identified. There is ill-defined ground-glass opacity in the LEFT lower  lobe on image 24, series 4. A second ground-glass nodule on image 30, series 4. There is a band of ground-glass opacity and subpleural nodularity in the RIGHT lower lobe on image 28-34. Subpleural component measures 4 mm on image 33, series 4 Upper abdomen: Limited view of the liver, kidneys, pancreas are unremarkable. Normal adrenal glands. Musculoskeletal: There are sclerotic lesions in the anterior aspect of the T3 and T4 vertebral body seen best on sagittal image 86, series 6. These lesions measure approximate 8 mm each. IMPRESSION: 1. Bilateral ground-glass nodular opacities the lower lobes. Recommend follow-up CT in 3 months to evaluate for persistent findings. 2. Bilateral prominent but not pathologic (by size criteria) axillary lymph nodes. 3. Sclerotic lesions within the T3 and T4 vertebral body are not typical location for degenerative change. Consider contrast MRI of thoracic spine for further evaluation. At minimum recommend attention on follow-up CT in 3 months as above. Electronically Signed   By:  Genevive Bi M.D.   On: 01/24/2015 10:14    ASSESSMENT & PLAN:   # Bil ground glass opacities- in lower lobes/prominent but not appear pathologic. Evaluated by thoracic surgery; recommend CAT scan in 3 months. I agree.  # IDA- sec to menorraghia- check CBC/ iron studies; if low recommend IV iron. Referral to gynecology.   All questions were answered. The patient knows to call the clinic with any problems, questions or concerns.  # Plan of care was discussed with the patient/through interpreter.    I spent 30 minutes counseling the patient face to face. The total time spent in the appointment was 40 minutes and more than 50% was on counseling.     Earna Coder, MD 02/07/2015 10:29 AM

## 2015-02-13 ENCOUNTER — Telehealth: Payer: Self-pay | Admitting: *Deleted

## 2015-02-13 NOTE — Telephone Encounter (Signed)
Pt [facility] with Dr. Dalbert GarnetBeasley on 02/19/2015 2 pm.  Called [facility] at [phone].  Staff took down the appt date and time and I gave them the location of the office also.

## 2015-02-14 ENCOUNTER — Telehealth: Payer: Self-pay | Admitting: *Deleted

## 2015-02-14 ENCOUNTER — Inpatient Hospital Stay: Payer: Medicare Other

## 2015-02-14 NOTE — Telephone Encounter (Signed)
[facility] and she was told while she was in office that she would probably need it.  When I called [facility] I was told to call the owner [facility] [phone] and she said to give her the dates and it was 11/4 and 11/11 both at 1:30 and that I am working on getting her an appt to see GYN for her heavy menstrual cycles and [facility] know when I got the appt and she was agreeable to this

## 2015-02-17 ENCOUNTER — Telehealth: Payer: Self-pay | Admitting: *Deleted

## 2015-02-17 NOTE — Telephone Encounter (Signed)
Owner of [facility] called to say that she has mistakenly missed pt appt for 11/4. She needs to make up for it.  I told her that she already has appt 11/11 and we will make a second appt to make up fot the first and make it11/18 both at 1:30.  Also told her that I called last week to tell her of [facility] with [facility] Dalbert GarnetBeasley and it is 11/9 2 pm. She states she has that one.

## 2015-02-21 ENCOUNTER — Inpatient Hospital Stay: Payer: Medicare Other | Attending: Internal Medicine

## 2015-02-21 VITALS — BP 96/56 | HR 81 | Temp 95.5°F | Resp 18

## 2015-02-21 DIAGNOSIS — D500 Iron deficiency anemia secondary to blood loss (chronic): Secondary | ICD-10-CM | POA: Insufficient documentation

## 2015-02-21 DIAGNOSIS — Z79899 Other long term (current) drug therapy: Secondary | ICD-10-CM | POA: Insufficient documentation

## 2015-02-21 MED ORDER — SODIUM CHLORIDE 0.9 % IV SOLN
INTRAVENOUS | Status: DC
  Administered 2015-02-21: 14:00:00 via INTRAVENOUS
  Filled 2015-02-21: qty 1000

## 2015-02-21 MED ORDER — SODIUM CHLORIDE 0.9 % IV SOLN
510.0000 mg | Freq: Once | INTRAVENOUS | Status: AC
  Administered 2015-02-21: 510 mg via INTRAVENOUS
  Filled 2015-02-21: qty 17

## 2015-02-28 ENCOUNTER — Inpatient Hospital Stay: Payer: Medicare Other

## 2015-02-28 DIAGNOSIS — D500 Iron deficiency anemia secondary to blood loss (chronic): Secondary | ICD-10-CM | POA: Diagnosis not present

## 2015-02-28 MED ORDER — SODIUM CHLORIDE 0.9 % IV SOLN
INTRAVENOUS | Status: DC
  Administered 2015-02-28: 14:00:00 via INTRAVENOUS
  Filled 2015-02-28: qty 1000

## 2015-02-28 MED ORDER — SODIUM CHLORIDE 0.9 % IV SOLN
510.0000 mg | Freq: Once | INTRAVENOUS | Status: AC
  Administered 2015-02-28: 510 mg via INTRAVENOUS
  Filled 2015-02-28: qty 17

## 2015-03-01 ENCOUNTER — Encounter: Payer: Self-pay | Admitting: Family Medicine

## 2015-04-23 ENCOUNTER — Other Ambulatory Visit: Payer: Self-pay | Admitting: *Deleted

## 2015-04-23 DIAGNOSIS — R911 Solitary pulmonary nodule: Secondary | ICD-10-CM

## 2015-04-24 ENCOUNTER — Inpatient Hospital Stay: Payer: Medicare Other

## 2015-04-24 ENCOUNTER — Inpatient Hospital Stay: Payer: Medicare Other | Admitting: Internal Medicine

## 2015-04-24 ENCOUNTER — Inpatient Hospital Stay: Payer: Medicare Other | Admitting: Cardiothoracic Surgery

## 2015-05-01 ENCOUNTER — Ambulatory Visit: Payer: Medicare Other | Admitting: Cardiothoracic Surgery

## 2015-05-02 ENCOUNTER — Ambulatory Visit
Admission: RE | Admit: 2015-05-02 | Discharge: 2015-05-02 | Disposition: A | Discharge status: Home / Self Care | Payer: Medicare Other | Source: Ambulatory Visit | Attending: Cardiothoracic Surgery | Admitting: Cardiothoracic Surgery

## 2015-05-02 DIAGNOSIS — R911 Solitary pulmonary nodule: Secondary | ICD-10-CM | POA: Diagnosis present

## 2015-05-08 ENCOUNTER — Inpatient Hospital Stay: Payer: Medicare Other | Attending: Internal Medicine

## 2015-05-08 ENCOUNTER — Encounter: Payer: Self-pay | Admitting: Internal Medicine

## 2015-05-08 ENCOUNTER — Inpatient Hospital Stay (HOSPITAL_BASED_OUTPATIENT_CLINIC_OR_DEPARTMENT_OTHER): Payer: Medicare Other | Admitting: Internal Medicine

## 2015-05-08 ENCOUNTER — Inpatient Hospital Stay (HOSPITAL_BASED_OUTPATIENT_CLINIC_OR_DEPARTMENT_OTHER): Payer: Medicare Other | Admitting: Cardiothoracic Surgery

## 2015-05-08 VITALS — BP 117/75 | HR 86 | Temp 97.5°F | Wt 201.7 lb

## 2015-05-08 DIAGNOSIS — K219 Gastro-esophageal reflux disease without esophagitis: Secondary | ICD-10-CM | POA: Diagnosis not present

## 2015-05-08 DIAGNOSIS — Z79899 Other long term (current) drug therapy: Secondary | ICD-10-CM | POA: Insufficient documentation

## 2015-05-08 DIAGNOSIS — N920 Excessive and frequent menstruation with regular cycle: Secondary | ICD-10-CM

## 2015-05-08 DIAGNOSIS — R918 Other nonspecific abnormal finding of lung field: Secondary | ICD-10-CM | POA: Diagnosis present

## 2015-05-08 DIAGNOSIS — Z794 Long term (current) use of insulin: Secondary | ICD-10-CM | POA: Insufficient documentation

## 2015-05-08 DIAGNOSIS — I10 Essential (primary) hypertension: Secondary | ICD-10-CM | POA: Diagnosis not present

## 2015-05-08 DIAGNOSIS — F329 Major depressive disorder, single episode, unspecified: Secondary | ICD-10-CM | POA: Diagnosis not present

## 2015-05-08 DIAGNOSIS — K5900 Constipation, unspecified: Secondary | ICD-10-CM | POA: Diagnosis not present

## 2015-05-08 DIAGNOSIS — Z7982 Long term (current) use of aspirin: Secondary | ICD-10-CM

## 2015-05-08 DIAGNOSIS — M129 Arthropathy, unspecified: Secondary | ICD-10-CM | POA: Diagnosis not present

## 2015-05-08 DIAGNOSIS — D509 Iron deficiency anemia, unspecified: Secondary | ICD-10-CM

## 2015-05-08 DIAGNOSIS — Z87891 Personal history of nicotine dependence: Secondary | ICD-10-CM | POA: Insufficient documentation

## 2015-05-08 DIAGNOSIS — Z7984 Long term (current) use of oral hypoglycemic drugs: Secondary | ICD-10-CM | POA: Diagnosis not present

## 2015-05-08 DIAGNOSIS — E119 Type 2 diabetes mellitus without complications: Secondary | ICD-10-CM | POA: Insufficient documentation

## 2015-05-08 DIAGNOSIS — D500 Iron deficiency anemia secondary to blood loss (chronic): Secondary | ICD-10-CM

## 2015-05-08 LAB — IRON AND TIBC
Iron: 28 ug/dL (ref 28–170)
Saturation Ratios: 10 % — ABNORMAL LOW (ref 10.4–31.8)
TIBC: 290 ug/dL (ref 250–450)
UIBC: 262 ug/dL

## 2015-05-08 LAB — CBC WITH DIFFERENTIAL/PLATELET
Basophils Absolute: 0.1 10*3/uL (ref 0–0.1)
Basophils Relative: 1 %
Eosinophils Absolute: 0.3 10*3/uL (ref 0–0.7)
Eosinophils Relative: 4 %
HEMATOCRIT: 37.9 % (ref 35.0–47.0)
Hemoglobin: 12.1 g/dL (ref 12.0–16.0)
LYMPHS PCT: 33 %
Lymphs Abs: 3 10*3/uL (ref 1.0–3.6)
MCH: 24.1 pg — ABNORMAL LOW (ref 26.0–34.0)
MCHC: 31.8 g/dL — AB (ref 32.0–36.0)
MCV: 75.6 fL — AB (ref 80.0–100.0)
MONO ABS: 0.4 10*3/uL (ref 0.2–0.9)
MONOS PCT: 5 %
NEUTROS ABS: 5.3 10*3/uL (ref 1.4–6.5)
Neutrophils Relative %: 57 %
Platelets: 383 10*3/uL (ref 150–440)
RBC: 5.01 MIL/uL (ref 3.80–5.20)
RDW: 22.7 % — AB (ref 11.5–14.5)
WBC: 9.1 10*3/uL (ref 3.6–11.0)

## 2015-05-08 LAB — FERRITIN: Ferritin: 37 ng/mL (ref 11–307)

## 2015-05-08 NOTE — Progress Notes (Signed)
[facility] CONSULT NOTE  Patient Care Team: [name], NP as PCP - General (Nurse Practitioner)  CHIEF COMPLAINTS/PURPOSE OF CONSULTATION:    # OCT 2016- BIL Lung Lower lobes hazies; Jan 2017- ? 3mm RLL/sub-optimal study/motion artifact; Recm CT in Sep 2017.   # IDA sec menorrhagia [EGD/Colo-NEG; May 2016; Dr.Rein]- PO iron  HISTORY OF PRESENTING ILLNESS:   Traci Pena 45 y.o.  female who has difficulty hearing/communication through an interpreter online. Patient has long-standing history of smoking; noted to have incidental bilateral lower lung hazy infiltrates on the CT scan in October 2016; is here to review the results of her surveillance CT scan.  Patient continues to have a history of heavy menstrual periods; on Depo-Provera shots. She is on by mouth iron; her energy levels are improving   ROS: No new cough or shortness of breath.   MEDICAL HISTORY:  Past Medical History  Diagnosis Date  . Hypertension   . Diabetes mellitus without complication (HCC)   . Gastritis   . Depression   . GERD (gastroesophageal reflux disease)   . Chronic constipation     SURGICAL HISTORY: Past Surgical History  Procedure Laterality Date  . Colonoscopy N/A 09/06/2014    Procedure: COLONOSCOPY;  Surgeon: Elnita Maxwell, MD;  Location: [facility] ENDOSCOPY;  Service: Endoscopy;  Laterality: N/A;    SOCIAL HISTORY: Social History   Social History  . Marital Status: Single    Spouse Name: N/A  . Number of Children: N/A  . Years of Education: N/A   Occupational History  . Not on file.   Social History Main Topics  . Smoking status: Current Every Day Smoker -- 4.00 packs/day for 3 years    Types: Cigarettes  . Smokeless tobacco: Never Used  . Alcohol Use: No  . Drug Use: No  . Sexual Activity: No   Other Topics Concern  . Not on file   Social History Narrative    FAMILY HISTORY: No family history on file.  ALLERGIES:  has No Known  Allergies.  MEDICATIONS:  Current Outpatient Prescriptions  Medication Sig Dispense Refill  . budesonide-formoterol (SYMBICORT) 160-4.5 MCG/ACT inhaler Inhale 2 puffs into the lungs 2 (two) times daily.    . medroxyPROGESTERone (DEPO-PROVERA) 150 MG/ML injection Inject 150 mg into the muscle every 3 (three) months.    . polyethylene glycol powder (GLYCOLAX/MIRALAX) powder use as directed    . albuterol (PROVENTIL HFA;VENTOLIN HFA) 108 (90 BASE) MCG/ACT inhaler Inhale 2 puffs into the lungs every 6 (six) hours as needed for wheezing or shortness of breath.    Marland Kitchen aspirin EC 81 MG tablet Take 81 mg by mouth daily.    Marland Kitchen atenolol (TENORMIN) 25 MG tablet Take by mouth daily.    . benztropine (COGENTIN) 1 MG tablet Take 1 mg by mouth 2 (two) times daily.    . budesonide-formoterol (SYMBICORT) 160-4.5 MCG/ACT inhaler Inhale 2 puffs into the lungs 2 (two) times daily.    . cetirizine (ZYRTEC) 10 MG tablet Take 10 mg by mouth daily.    . cloZAPine (CLOZARIL) 100 MG tablet Take 100 mg by mouth daily.    . fluticasone (FLONASE) 50 MCG/ACT nasal spray Place 1 spray into both nostrils daily.    . Insulin Glargine (TOUJEO SOLOSTAR) 300 UNIT/ML SOPN Inject into the skin.    Marland Kitchen insulin lispro (HUMALOG) 100 UNIT/ML KiwkPen Inject into the skin.    Marland Kitchen iron polysaccharides (NIFEREX) 150 MG capsule Take 150 mg by mouth daily.    Marland Kitchen  lisinopril (PRINIVIL,ZESTRIL) 10 MG tablet Take 5 mg by mouth daily.    Marland Kitchen lithium 600 MG capsule Take 600 mg by mouth at bedtime.    . metFORMIN (GLUCOPHAGE) 1000 MG tablet Take 1,000 mg by mouth 2 (two) times daily with a meal.    . montelukast (SINGULAIR) 10 MG tablet Take 10 mg by mouth at bedtime.    . Multiple Vitamin (MULTIVITAMIN) tablet Take 1 tablet by mouth daily.    Marland Kitchen omeprazole (PRILOSEC) 40 MG capsule Take 40 mg by mouth daily.    . ondansetron (ZOFRAN) 4 MG tablet Take by mouth.    . sertraline (ZOLOFT) 50 MG tablet Take 50 mg by mouth daily.    . simvastatin (ZOCOR) 20 MG  tablet Take by mouth.    . tiotropium (SPIRIVA) 18 MCG inhalation capsule Place 18 mcg into inhaler and inhale daily.     No current facility-administered medications for this visit.      Marland Kitchen  PHYSICAL EXAMINATION: ECOG PERFORMANCE STATUS: 0 - Asymptomatic  There were no vitals filed for this visit. There were no vitals filed for this visit.  GENERAL: Well-nourished well-developed; Alert, no distress and comfortable.  She is accompanied by caregiver. Given the patient's difficulty hearing/communication through interpreter. EYES: no pallor or icterus OROPHARYNX: no thrush or ulceration; good dentition  NECK: supple, no masses felt LYMPH:  no palpable lymphadenopathy in the cervical, axillary or inguinal regions LUNGS: clear to auscultation and  No wheeze or crackles HEART/CVS: regular rate & rhythm and no murmurs; No lower extremity edema ABDOMEN: abdomen soft, non-tender and normal bowel sounds Musculoskeletal:no cyanosis of digits and no clubbing  PSYCH: alert & oriented x 3.  NEURO: no focal motor deficits. Patient is deaf.  SKIN:  no rashes or significant lesions  LABORATORY DATA:  I have reviewed the data as listed Lab Results  Component Value Date   WBC 9.1 05/08/2015   HGB 12.1 05/08/2015   HCT 37.9 05/08/2015   MCV 75.6* 05/08/2015   PLT 383 05/08/2015   No results for input(s): NA, K, CL, CO2, GLUCOSE, BUN, CREATININE, CALCIUM, GFRNONAA, GFRAA, PROT, ALBUMIN, AST, ALT, ALKPHOS, BILITOT, BILIDIR, IBILI in the last 8760 hours.  RADIOGRAPHIC STUDIES: I have personally reviewed the radiological images as listed and agreed with the findings in the report. Ct Chest Wo Contrast  05/02/2015  CLINICAL DATA:  Followup bilateral lower lobe ground-glass nodules. EXAM: CT CHEST WITHOUT CONTRAST TECHNIQUE: Multidetector CT imaging of the chest was performed following the standard protocol without IV contrast. COMPARISON:  CT scan 01/24/2015 FINDINGS: Mediastinum/Nodes: Examination  is limited by a breathing motion artifact. No breast masses, supraclavicular or axillary adenopathy. Numerous scattered axillary lymph nodes appear stable. The heart is normal in size. No pericardial effusion. The aorta is normal in caliber. No mediastinal or hilar mass or adenopathy. Lungs/Pleura: Exam limited by breathing motion artifact. No gross abnormality is identified. I do not see any obvious worrisome pulmonary lesions. Suspect a 3 mm nodule in the right lower lobe on image number 21 which is stable. Upper abdomen: No obvious findings. Musculoskeletal: No significant findings. IMPRESSION: Examination is quite limited due to breathing motion artifact, despite attempted rescanning. I do not see any obvious ground-glass nodules. Stable 3 mm nodule in the right lower lobe. No mediastinal or hilar mass or adenopathy. Recommend follow-up noncontrast chest CT in October 2016 which would be a 1 year followup from the original chest CT examination. Electronically Signed   By: Orlene Plum.D.  On: 05/02/2015 16:25    ASSESSMENT & PLAN:   # Bil ground glass opacities- in lower lobes/prominent but not appear pathologic [October 2016]; Jan 2017 CT scan limited by motion- but no obvious abnormality is noted/stable 3 mm right lower lobe nodule. Recommend a repeat CT scan in September/October 2017  # IDA- sec to menorraghia- on by mouth iron once a day; hemoglobin is improved to 12.3 from previous 8.3. However MCV is to 75. Recommend increasing the iron to twice a day.   # Plan of care was discussed with the patient/through interpreter. 15 minutes face-to-face with the patient discussing the above plan of care; more than 50% of time spent on counseling and coordination.      Earna Coder, MD 05/08/2015 11:39 AM

## 2015-05-08 NOTE — Patient Instructions (Signed)
Please take oral iron twice daily.

## 2015-05-08 NOTE — Progress Notes (Signed)
[facility] Inpatient Post-Op Note  Patient ID: Traci Pena, female   DOB: 06-12-1970, 45 y.o.   MRN: [id number]  HISTORY: She returns today in follow-up. She did have a CT scan performed in follow-up of her lung lesions. The CT scan had extensive motion artifact and it was of poor quality but there was no obvious lung mass. Patient continues to smoke at least a half a pack cigarettes a day and she has occasional shortness of breath. We did counsel her on smoking cessation.   Filed Vitals:   05/08/15 1032  BP: 117/75  Pulse: 86  Temp: 97.5 F (36.4 C)     EXAM: Resp: Lungs are clear bilaterally.  No respiratory distress, normal effort. Heart:  Regular without murmurs Abd:  Abdomen is soft, non distended and non tender. No masses are palpable.  There is no rebound and no guarding.  Neurological: Alert and oriented to person, place, and time. Coordination normal.  Skin: Skin is warm and dry. No rash noted. No diaphoretic. No erythema. No pallor.  Psychiatric: Normal mood and affect. Normal behavior. Judgment and thought content normal.    ASSESSMENT: I have independently reviewed the chest CT. I see no obvious mass. She'll come back to see [place] in October for repeat CT scan.   PLAN:   CT scan October for follow-up of her lung mass    Hulda Marin, MD

## 2015-05-09 ENCOUNTER — Telehealth: Payer: Self-pay | Admitting: *Deleted

## 2015-05-09 NOTE — Telephone Encounter (Signed)
MD asked pt yesterday to keep taking the same oral iron over the counter as she has been. Please let pt know.

## 2015-05-09 NOTE — Telephone Encounter (Signed)
Notified pharmacy who said that her iron is not OTC,so he is makking it a prescription

## 2015-05-09 NOTE — Telephone Encounter (Signed)
Per md, pt should take her current iron tablet twice a day. I reviewed chart. Ok to RF if needed, but needs to take twice a day.

## 2015-05-09 NOTE — Telephone Encounter (Signed)
Rx says to take iron bid =, but does not specify what iron. She is currently on poly iron and needs to know if she is to take that bis or are you changing it to another preparation

## 2015-06-17 ENCOUNTER — Other Ambulatory Visit: Payer: Self-pay | Admitting: Nurse Practitioner

## 2015-06-17 DIAGNOSIS — Z1231 Encounter for screening mammogram for malignant neoplasm of breast: Secondary | ICD-10-CM

## 2015-06-18 ENCOUNTER — Other Ambulatory Visit: Payer: Self-pay | Admitting: Nurse Practitioner

## 2015-06-18 DIAGNOSIS — Z1231 Encounter for screening mammogram for malignant neoplasm of breast: Secondary | ICD-10-CM

## 2015-06-18 DIAGNOSIS — N6310 Unspecified lump in the right breast, unspecified quadrant: Secondary | ICD-10-CM

## 2015-07-11 ENCOUNTER — Ambulatory Visit
Admission: RE | Admit: 2015-07-11 | Discharge: 2015-07-11 | Disposition: A | Discharge status: Home / Self Care | Payer: Medicare Other | Source: Ambulatory Visit | Attending: Nurse Practitioner | Admitting: Nurse Practitioner

## 2015-07-11 DIAGNOSIS — N6310 Unspecified lump in the right breast, unspecified quadrant: Secondary | ICD-10-CM

## 2015-07-11 DIAGNOSIS — R922 Inconclusive mammogram: Secondary | ICD-10-CM | POA: Diagnosis not present

## 2015-07-11 DIAGNOSIS — N63 Unspecified lump in breast: Secondary | ICD-10-CM | POA: Diagnosis not present

## 2015-07-11 DIAGNOSIS — Z1231 Encounter for screening mammogram for malignant neoplasm of breast: Secondary | ICD-10-CM

## 2016-01-30 ENCOUNTER — Other Ambulatory Visit: Payer: Self-pay

## 2016-01-30 DIAGNOSIS — D500 Iron deficiency anemia secondary to blood loss (chronic): Secondary | ICD-10-CM

## 2016-02-04 ENCOUNTER — Other Ambulatory Visit: Payer: Self-pay

## 2016-02-05 ENCOUNTER — Encounter (INDEPENDENT_AMBULATORY_CARE_PROVIDER_SITE_OTHER): Payer: Self-pay

## 2016-02-05 ENCOUNTER — Ambulatory Visit
Admission: RE | Admit: 2016-02-05 | Discharge: 2016-02-05 | Disposition: A | Discharge status: Home / Self Care | Payer: Medicare Other | Source: Ambulatory Visit | Attending: Cardiothoracic Surgery | Admitting: Cardiothoracic Surgery

## 2016-02-05 ENCOUNTER — Other Ambulatory Visit: Payer: Self-pay

## 2016-02-05 ENCOUNTER — Inpatient Hospital Stay: Payer: Medicare Other | Attending: Internal Medicine

## 2016-02-05 ENCOUNTER — Ambulatory Visit: Payer: Medicare Other | Admitting: Cardiothoracic Surgery

## 2016-02-05 ENCOUNTER — Inpatient Hospital Stay (HOSPITAL_BASED_OUTPATIENT_CLINIC_OR_DEPARTMENT_OTHER): Payer: Medicare Other | Admitting: Internal Medicine

## 2016-02-05 VITALS — BP 108/69 | HR 87 | Temp 96.1°F | Resp 18 | Wt 219.4 lb

## 2016-02-05 DIAGNOSIS — F329 Major depressive disorder, single episode, unspecified: Secondary | ICD-10-CM | POA: Diagnosis not present

## 2016-02-05 DIAGNOSIS — K5900 Constipation, unspecified: Secondary | ICD-10-CM | POA: Insufficient documentation

## 2016-02-05 DIAGNOSIS — K219 Gastro-esophageal reflux disease without esophagitis: Secondary | ICD-10-CM | POA: Diagnosis not present

## 2016-02-05 DIAGNOSIS — D500 Iron deficiency anemia secondary to blood loss (chronic): Secondary | ICD-10-CM | POA: Diagnosis not present

## 2016-02-05 DIAGNOSIS — R918 Other nonspecific abnormal finding of lung field: Secondary | ICD-10-CM

## 2016-02-05 DIAGNOSIS — K2970 Gastritis, unspecified, without bleeding: Secondary | ICD-10-CM

## 2016-02-05 DIAGNOSIS — Z79899 Other long term (current) drug therapy: Secondary | ICD-10-CM | POA: Diagnosis not present

## 2016-02-05 DIAGNOSIS — E119 Type 2 diabetes mellitus without complications: Secondary | ICD-10-CM

## 2016-02-05 DIAGNOSIS — Z7982 Long term (current) use of aspirin: Secondary | ICD-10-CM

## 2016-02-05 DIAGNOSIS — Z794 Long term (current) use of insulin: Secondary | ICD-10-CM | POA: Diagnosis not present

## 2016-02-05 DIAGNOSIS — M6530 Trigger finger, unspecified finger: Secondary | ICD-10-CM

## 2016-02-05 DIAGNOSIS — N920 Excessive and frequent menstruation with regular cycle: Secondary | ICD-10-CM

## 2016-02-05 DIAGNOSIS — I10 Essential (primary) hypertension: Secondary | ICD-10-CM

## 2016-02-05 DIAGNOSIS — F17210 Nicotine dependence, cigarettes, uncomplicated: Secondary | ICD-10-CM

## 2016-02-05 LAB — CBC WITH DIFFERENTIAL/PLATELET
BASOS PCT: 1 %
Basophils Absolute: 0.1 10*3/uL (ref 0–0.1)
EOS ABS: 0.4 10*3/uL (ref 0–0.7)
Eosinophils Relative: 4 %
HCT: 34 % — ABNORMAL LOW (ref 35.0–47.0)
HEMOGLOBIN: 11.1 g/dL — AB (ref 12.0–16.0)
LYMPHS ABS: 3.8 10*3/uL — AB (ref 1.0–3.6)
Lymphocytes Relative: 35 %
MCH: 24.2 pg — AB (ref 26.0–34.0)
MCHC: 32.5 g/dL (ref 32.0–36.0)
MCV: 74.3 fL — ABNORMAL LOW (ref 80.0–100.0)
Monocytes Absolute: 0.4 10*3/uL (ref 0.2–0.9)
Monocytes Relative: 4 %
NEUTROS PCT: 56 %
Neutro Abs: 6.1 10*3/uL (ref 1.4–6.5)
Platelets: 444 10*3/uL — ABNORMAL HIGH (ref 150–440)
RBC: 4.57 MIL/uL (ref 3.80–5.20)
RDW: 16.9 % — ABNORMAL HIGH (ref 11.5–14.5)
WBC: 10.8 10*3/uL (ref 3.6–11.0)

## 2016-02-05 LAB — FERRITIN: FERRITIN: 30 ng/mL (ref 11–307)

## 2016-02-05 LAB — BASIC METABOLIC PANEL
Anion gap: 6 (ref 5–15)
BUN: 10 mg/dL (ref 6–20)
CALCIUM: 9.2 mg/dL (ref 8.9–10.3)
CO2: 27 mmol/L (ref 22–32)
Chloride: 101 mmol/L (ref 101–111)
Creatinine, Ser: 0.59 mg/dL (ref 0.44–1.00)
GFR calc Af Amer: >60 mL/min (ref >60)
GLUCOSE: 122 mg/dL — AB (ref 65–99)
Potassium: 4 mmol/L (ref 3.5–5.1)
Sodium: 134 mmol/L — ABNORMAL LOW (ref 135–145)

## 2016-02-05 LAB — IRON AND TIBC
IRON: 38 ug/dL (ref 28–170)
SATURATION RATIOS: 13 % (ref 10.4–31.8)
TIBC: 297 ug/dL (ref 250–450)
UIBC: 259 ug/dL

## 2016-02-05 NOTE — Progress Notes (Signed)
[facility] CONSULT NOTE  Patient Care Team: [name], NP as PCP - General (Nurse Practitioner)  CHIEF COMPLAINTS/PURPOSE OF CONSULTATION:    # OCT 2016- BIL Lung Lower lobes hazies; Jan 2017- ? 3mm RLL/sub-optimal study/motion artifact; CT- OCT 2017- improved bil opacities; 4 mm lung nodule ; Recom 12 month follow up  # IDA sec menorrhagia [EGD/Colo-NEG; May 2016; Dr.Rein]- PO iron; smoker  HISTORY OF PRESENTING ILLNESS:   Traci Pena 45 y.o.  female who has difficulty hearing/communication through an interpreter online. Patient has long-standing history of smoking; noted to have incidental bilateral lower lung hazy infiltrates on the CT scan in October 2016; is here to review the results of her surveillance CT scan.  Patient is currently on Depot Provera shots through her PCP. She continues to have intermittent heavy menstrual periods no blood in stools. She is on by mouth iron; her energy levels are improving. Complains of trigger finger.   ROS: No new cough or shortness of breath. Unfortunately continues to smoke. A complete 10 point review of system is done which is negative except mentioned above in history of present illness.  MEDICAL HISTORY:  Past Medical History:  Diagnosis Date  . Chronic constipation   . Depression   . Diabetes mellitus without complication (HCC)   . Gastritis   . GERD (gastroesophageal reflux disease)   . Hypertension     SURGICAL HISTORY: Past Surgical History:  Procedure Laterality Date  . COLONOSCOPY N/A 09/06/2014   Procedure: COLONOSCOPY;  Surgeon: Elnita Maxwell, MD;  Location: [facility] ENDOSCOPY;  Service: Endoscopy;  Laterality: N/A;    SOCIAL HISTORY: Social History   Social History  . Marital status: Single    Spouse name: N/A  . Number of children: N/A  . Years of education: N/A   Occupational History  . Not on file.   Social History Main Topics  . Smoking status: Current Every Day Smoker    Packs/day:  4.00    Years: 3.00    Types: Cigarettes  . Smokeless tobacco: Never Used  . Alcohol use No  . Drug use: No  . Sexual activity: No   Other Topics Concern  . Not on file   Social History Narrative  . No narrative on file    FAMILY HISTORY: No family history on file.  ALLERGIES:  has No Known Allergies.  MEDICATIONS:  Current Outpatient Prescriptions  Medication Sig Dispense Refill  . aspirin EC 81 MG tablet Take 81 mg by mouth daily.    Marland Kitchen atenolol (TENORMIN) 25 MG tablet Take by mouth daily.    . benztropine (COGENTIN) 1 MG tablet Take 1 mg by mouth 2 (two) times daily.    . budesonide-formoterol (SYMBICORT) 160-4.5 MCG/ACT inhaler Inhale 2 puffs into the lungs 2 (two) times daily.    . cetirizine (ZYRTEC) 10 MG tablet Take 10 mg by mouth daily.    . clonazePAM (KLONOPIN) 0.5 MG tablet Take 0.5 mg by mouth at bedtime.    . cloZAPine (CLOZARIL) 100 MG tablet Take 1 tablet in the morning and 2 tablets before bed    . fluticasone (FLONASE) 50 MCG/ACT nasal spray Place 1 spray into both nostrils daily.    . insulin aspart (NOVOLOG FLEXPEN) 100 UNIT/ML FlexPen Inject 8 Units into the skin 3 (three) times daily with meals.    . Insulin Glargine (TOUJEO SOLOSTAR) 300 UNIT/ML SOPN Inject into the skin.    Marland Kitchen iron polysaccharides (NIFEREX) 150 MG capsule Take 150 mg  by mouth daily.    Marland Kitchen. lisinopril (PRINIVIL,ZESTRIL) 10 MG tablet Take 5 mg by mouth daily.    Marland Kitchen. lithium 600 MG capsule Take 600 mg by mouth at bedtime.    . medroxyPROGESTERone (DEPO-PROVERA) 150 MG/ML injection Inject 150 mg into the muscle every 3 (three) months.    . metFORMIN (GLUCOPHAGE) 1000 MG tablet Take 1,000 mg by mouth 2 (two) times daily with a meal.    . montelukast (SINGULAIR) 10 MG tablet Take 10 mg by mouth at bedtime.    . Multiple Vitamin (MULTIVITAMIN) tablet Take 1 tablet by mouth daily.    Marland Kitchen. omeprazole (PRILOSEC) 40 MG capsule Take 40 mg by mouth daily.    . ondansetron (ZOFRAN) 4 MG tablet Take by mouth.     . sertraline (ZOLOFT) 50 MG tablet Take 50 mg by mouth daily.    . simvastatin (ZOCOR) 20 MG tablet Take by mouth.    . tiotropium (SPIRIVA) 18 MCG inhalation capsule Place 18 mcg into inhaler and inhale daily.    Marland Kitchen. albuterol (PROVENTIL HFA;VENTOLIN HFA) 108 (90 BASE) MCG/ACT inhaler Inhale 2 puffs into the lungs every 6 (six) hours as needed for wheezing or shortness of breath.    . insulin lispro (HUMALOG) 100 UNIT/ML KiwkPen Inject into the skin.    . polyethylene glycol powder (GLYCOLAX/MIRALAX) powder use as directed     No current facility-administered medications for this visit.       Marland Kitchen.  PHYSICAL EXAMINATION: ECOG PERFORMANCE STATUS: 0 - Asymptomatic  Vitals:   02/05/16 1151  BP: 108/69  Pulse: 87  Resp: 18  Temp: (!) 96.1 F (35.6 C)   Filed Weights   02/05/16 1151  Weight: 219 lb 6.4 oz (99.5 kg)    GENERAL: Well-nourished well-developed; Alert, no distress and comfortable.  She is accompanied by caregiver. Given the patient's difficulty hearing/communication through interpreter. EYES: no pallor or icterus OROPHARYNX: no thrush or ulceration; good dentition  NECK: supple, no masses felt LYMPH:  no palpable lymphadenopathy in the cervical, axillary or inguinal regions LUNGS: clear to auscultation and  No wheeze or crackles HEART/CVS: regular rate & rhythm and no murmurs; No lower extremity edema ABDOMEN: abdomen soft, non-tender and normal bowel sounds Musculoskeletal:no cyanosis of digits and no clubbing  PSYCH: alert & oriented x 3.  NEURO: no focal motor deficits. Patient is deaf.  SKIN:  no rashes or significant lesions  LABORATORY DATA:  I have reviewed the data as listed Lab Results  Component Value Date   WBC 10.8 02/05/2016   HGB 11.1 (L) 02/05/2016   HCT 34.0 (L) 02/05/2016   MCV 74.3 (L) 02/05/2016   PLT 444 (H) 02/05/2016    Recent Labs  02/05/16 1020  NA 134*  K 4.0  CL 101  CO2 27  GLUCOSE 122*  BUN 10  CREATININE 0.59  CALCIUM  9.2  GFRNONAA >60  GFRAA >60    RADIOGRAPHIC STUDIES: I have personally reviewed the radiological images as listed and agreed with the findings in the report. Ct Chest Wo Contrast  Result Date: 02/05/2016 CLINICAL DATA:  Follow-up pulmonary nodules. EXAM: CT CHEST WITHOUT CONTRAST TECHNIQUE: Multidetector CT imaging of the chest was performed following the standard protocol without IV contrast. COMPARISON:  05/02/2015 and 01/24/2015 chest CT studies. FINDINGS: Significantly motion degraded scan. Cardiovascular: Normal heart size. No significant pericardial fluid/thickening. Great vessels are normal in course and caliber. Mediastinum/Nodes: No discrete thyroid nodules. Unremarkable esophagus. No pathologically enlarged axillary, mediastinal or gross hilar lymph  nodes, noting limited sensitivity for the detection of hilar adenopathy on this noncontrast study. Lungs/Pleura: No pneumothorax. No pleural effusion. No acute consolidative airspace disease or lung masses. Anterior right lower lobe 4 mm solid pulmonary nodule (series 4/ image 43) is stable back to 01/24/2015 and considered benign. New minimal patchy tree-in-bud opacity in the posterior right lower lobe (series 4/ image 63). New 4 mm superior segment left lower lobe pulmonary nodule (series 4/ image 52). Previously described areas of ground-glass opacity in the posterior lower lobes on the 01/24/2015 chest CT study have resolved. Upper abdomen: Unremarkable. Musculoskeletal: No aggressive appearing focal osseous lesions. Mild patchy sclerotic change in the anterior inferior T4 and T5 vertebral bodies is stable back to 01/24/2015 and probably degenerative. IMPRESSION: 1. Significantly motion degraded scan. 2. Stable right lower lobe 4 mm pulmonary nodule, for which 12 month stability has been demonstrated, considered benign. New superior segment left lower lobe 4 mm pulmonary nodule. No follow-up needed if patient is low-risk (and has no known or  suspected primary neoplasm). Non-contrast chest CT can be considered in 12 months if patient is high-risk. This recommendation follows the consensus statement: Guidelines for Management of Incidental Pulmonary Nodules Detected on CT Images: From the Fleischner Society 2017; Radiology 2017; 284:228-243. 3. Minimal new patchy tree-in-bud opacity in the posterior right lower lobe, consistent with minimal infectious or inflammatory bronchiolitis, of dubious clinical significance. 4. Previously described areas of ground-glass opacity in the lower lobes on the 01/24/2015 chest CT study have resolved. Electronically Signed   By: Delbert Phenix M.D.   On: 02/05/2016 11:40    ASSESSMENT & PLAN:   Iron deficiency anemia due to chronic blood loss # IDA- sec to menorraghia- on by mouth iron once a day; hemoglobin is improved to 11 from previous 8.3.  On PO Iron BID. Recommend Gynecologist evaluation.   # Bil ground glass opacities-improved; 4mm lung nodule- likely benign recommend CT scan in 12 months.   # Discussed smoking cessation; pt not keen on quitting smoking.  # Trigger finger- call PCP.    # Follow up in 12 months/ CT scan/ Iron studies/bc/bmp.         Earna Coder, MD 02/05/2016 2:18 PM

## 2016-02-05 NOTE — Assessment & Plan Note (Addendum)
#   IDA- sec to menorraghia- on by mouth iron once a day; hemoglobin is improved to 11 from previous 8.3.  On PO Iron BID. Recommend Gynecologist evaluation.   # Bil ground glass opacities-improved; 4mm lung nodule- likely benign recommend CT scan in 12 months.   # Discussed smoking cessation; pt not keen on quitting smoking.  # Trigger finger- call PCP.    # Follow up in 12 months/ CT scan/ Iron studies/bc/bmp.

## 2016-02-06 ENCOUNTER — Encounter: Payer: Self-pay | Admitting: Cardiothoracic Surgery

## 2016-02-06 ENCOUNTER — Ambulatory Visit (INDEPENDENT_AMBULATORY_CARE_PROVIDER_SITE_OTHER): Payer: Medicare Other | Admitting: Cardiothoracic Surgery

## 2016-02-06 ENCOUNTER — Ambulatory Visit: Payer: Medicare Other | Admitting: Cardiothoracic Surgery

## 2016-02-06 VITALS — BP 140/90 | HR 80 | Temp 98.7°F | Wt 219.0 lb

## 2016-02-06 DIAGNOSIS — R911 Solitary pulmonary nodule: Secondary | ICD-10-CM

## 2016-02-06 NOTE — Progress Notes (Signed)
  Patient ID: Traci Pena, female   DOB: 10/11/1970, 45 y.o.   MRN: [id number]  HISTORY: She returns today in follow-up. This interview was performed with an interpreter present. The patient states that she does continue to smoke. She does get short of breath with activities. She states that occasionally the cold weather will make her cough as well.   Vitals:   02/06/16 1121  BP: 140/90  Pulse: 80  Temp: 98.7 F (37.1 C)     EXAM:    Resp: Lungs are clear bilaterally.  No respiratory distress, normal effort. Heart:  Regular without murmurs Abd:  Abdomen is soft, non distended and non tender. No masses are palpable.  There is no rebound and no guarding.  Neurological: Alert and oriented to person, place, and time. Coordination normal.  Skin: Skin is warm and dry. No rash noted. No diaphoretic. No erythema. No pallor.  Psychiatric: Normal mood and affect. Normal behavior. Judgment and thought content normal.    ASSESSMENT: I have independently reviewed the patient's CT scan. There are 2 nodules present both measuring 3-4 mm in size. One is in the right lower lobe and the other is in the left lower lobe. There is some motion artifact present which does not permit further evaluation.   PLAN:   I had a long discussion with the patient again through the interpreter. I explained to her that these nodules were stable on the right and new on the left and that a repeat CT scan in one year was recommended. She will do that. She also would like some information about smoking cessation. I did counsel her regarding that and we will put her in touch with one of our nurse navigators regarding the smoking cessation program. She would also like a referral for her dental work and we will see if we can [facility]. We'll see her back again in one year.    Hulda Marinimothy Kamea Dacosta, MD

## 2016-02-06 NOTE — Patient Instructions (Signed)
We will be contacting you in a year to schedule your CT Scan and then follow up with Dr. Thelma Bargeaks.

## 2016-02-09 ENCOUNTER — Telehealth: Payer: Self-pay

## 2016-02-09 NOTE — Telephone Encounter (Signed)
Called Shawn-RN Navigator to let him know that Dr. Thelma Bargeaks wanted him to help her with the smoking cessation program. I also told him that she needed a sign language interpreter. Shawn understood.

## 2016-02-10 ENCOUNTER — Telehealth: Payer: Self-pay

## 2016-02-10 NOTE — Telephone Encounter (Signed)
Please refer [facility] to [facility] per Dr. Thelma Bargeaks.

## 2016-02-11 NOTE — Telephone Encounter (Signed)
I have called [facility] per the request of the Surgeon. [phone]. No answer. Not able to leave a message. The message says to please try back. I will continue to call until I am able to reach someone.

## 2016-02-13 NOTE — Telephone Encounter (Signed)
I have reached a represenitive at [facility] to discuss referral.   Patient will need to go online and apply at [email or web address].   Go to How To Become A Patient and will out the necessary information and then choose submit.   The represenitive states that this can not be mailed in, this is only an online process.   I have called the patient to advise her of this information. No answer. I have left a message on voicemail.

## 2016-02-18 ENCOUNTER — Telehealth: Payer: Self-pay | Admitting: *Deleted

## 2016-02-18 NOTE — Telephone Encounter (Signed)
Received referral from Dr. Thelma Bargeaks to contact patient regarding smoking cessation. Specifically [facility] to contact ChinchillaBeverly who is a care provider for patient as patient is deaf. I spoke at length with Meriam SpragueBeverly who indicated that patient has no interest in smoking cessation at this time. I emphasized that we will obtain a translator to provide the services to Ms. Shawnie Ponsratt when she is interested. Given my contact information.

## 2016-04-20 DIAGNOSIS — F250 Schizoaffective disorder, bipolar type: Secondary | ICD-10-CM | POA: Diagnosis not present

## 2016-05-03 DIAGNOSIS — I10 Essential (primary) hypertension: Secondary | ICD-10-CM | POA: Diagnosis not present

## 2016-05-03 DIAGNOSIS — E559 Vitamin D deficiency, unspecified: Secondary | ICD-10-CM | POA: Diagnosis not present

## 2016-05-03 DIAGNOSIS — E785 Hyperlipidemia, unspecified: Secondary | ICD-10-CM | POA: Diagnosis not present

## 2016-05-03 DIAGNOSIS — D509 Iron deficiency anemia, unspecified: Secondary | ICD-10-CM | POA: Diagnosis not present

## 2016-05-06 ENCOUNTER — Other Ambulatory Visit: Payer: Self-pay | Admitting: Nurse Practitioner

## 2016-05-06 DIAGNOSIS — E669 Obesity, unspecified: Secondary | ICD-10-CM | POA: Diagnosis not present

## 2016-05-06 DIAGNOSIS — I10 Essential (primary) hypertension: Secondary | ICD-10-CM | POA: Diagnosis not present

## 2016-05-06 DIAGNOSIS — Z1231 Encounter for screening mammogram for malignant neoplasm of breast: Secondary | ICD-10-CM

## 2016-05-06 DIAGNOSIS — E785 Hyperlipidemia, unspecified: Secondary | ICD-10-CM | POA: Diagnosis not present

## 2016-05-06 DIAGNOSIS — E118 Type 2 diabetes mellitus with unspecified complications: Secondary | ICD-10-CM | POA: Diagnosis not present

## 2016-05-06 DIAGNOSIS — D509 Iron deficiency anemia, unspecified: Secondary | ICD-10-CM | POA: Diagnosis not present

## 2016-05-12 DIAGNOSIS — Z308 Encounter for other contraceptive management: Secondary | ICD-10-CM | POA: Diagnosis not present

## 2016-05-17 DIAGNOSIS — F250 Schizoaffective disorder, bipolar type: Secondary | ICD-10-CM | POA: Diagnosis not present

## 2016-06-02 DIAGNOSIS — F250 Schizoaffective disorder, bipolar type: Secondary | ICD-10-CM | POA: Diagnosis not present

## 2016-06-14 DIAGNOSIS — F250 Schizoaffective disorder, bipolar type: Secondary | ICD-10-CM | POA: Diagnosis not present

## 2016-06-14 DIAGNOSIS — E119 Type 2 diabetes mellitus without complications: Secondary | ICD-10-CM | POA: Diagnosis not present

## 2016-06-14 DIAGNOSIS — Z794 Long term (current) use of insulin: Secondary | ICD-10-CM | POA: Diagnosis not present

## 2016-06-17 DIAGNOSIS — Z6839 Body mass index (BMI) 39.0-39.9, adult: Secondary | ICD-10-CM | POA: Diagnosis not present

## 2016-06-17 DIAGNOSIS — E1165 Type 2 diabetes mellitus with hyperglycemia: Secondary | ICD-10-CM | POA: Diagnosis not present

## 2016-06-17 DIAGNOSIS — E119 Type 2 diabetes mellitus without complications: Secondary | ICD-10-CM | POA: Diagnosis not present

## 2016-06-17 DIAGNOSIS — Z794 Long term (current) use of insulin: Secondary | ICD-10-CM | POA: Diagnosis not present

## 2016-06-17 DIAGNOSIS — F17200 Nicotine dependence, unspecified, uncomplicated: Secondary | ICD-10-CM | POA: Diagnosis not present

## 2016-07-13 ENCOUNTER — Ambulatory Visit
Admission: RE | Admit: 2016-07-13 | Discharge: 2016-07-13 | Disposition: A | Discharge status: Home / Self Care | Payer: Medicare Other | Source: Ambulatory Visit | Attending: Nurse Practitioner | Admitting: Nurse Practitioner

## 2016-07-13 DIAGNOSIS — Z1231 Encounter for screening mammogram for malignant neoplasm of breast: Secondary | ICD-10-CM | POA: Insufficient documentation

## 2016-07-14 DIAGNOSIS — F250 Schizoaffective disorder, bipolar type: Secondary | ICD-10-CM | POA: Diagnosis not present

## 2016-07-15 ENCOUNTER — Other Ambulatory Visit: Payer: Self-pay | Admitting: Nurse Practitioner

## 2016-07-15 DIAGNOSIS — N6320 Unspecified lump in the left breast, unspecified quadrant: Secondary | ICD-10-CM

## 2016-07-15 DIAGNOSIS — N6489 Other specified disorders of breast: Secondary | ICD-10-CM | POA: Diagnosis not present

## 2016-07-15 DIAGNOSIS — R928 Other abnormal and inconclusive findings on diagnostic imaging of breast: Secondary | ICD-10-CM

## 2016-07-15 DIAGNOSIS — R922 Inconclusive mammogram: Secondary | ICD-10-CM | POA: Diagnosis not present

## 2016-07-15 DIAGNOSIS — N6310 Unspecified lump in the right breast, unspecified quadrant: Secondary | ICD-10-CM

## 2016-07-22 ENCOUNTER — Other Ambulatory Visit: Payer: Medicare Other

## 2016-07-22 ENCOUNTER — Ambulatory Visit: Payer: Medicare Other | Attending: Nurse Practitioner

## 2016-08-06 ENCOUNTER — Ambulatory Visit
Admission: RE | Admit: 2016-08-06 | Discharge: 2016-08-06 | Disposition: A | Discharge status: Home / Self Care | Payer: Medicare Other | Source: Ambulatory Visit | Attending: Nurse Practitioner | Admitting: Nurse Practitioner

## 2016-08-06 DIAGNOSIS — N6310 Unspecified lump in the right breast, unspecified quadrant: Secondary | ICD-10-CM

## 2016-08-06 DIAGNOSIS — R928 Other abnormal and inconclusive findings on diagnostic imaging of breast: Secondary | ICD-10-CM

## 2016-08-06 DIAGNOSIS — N6320 Unspecified lump in the left breast, unspecified quadrant: Secondary | ICD-10-CM | POA: Diagnosis not present

## 2016-08-06 DIAGNOSIS — N6311 Unspecified lump in the right breast, upper outer quadrant: Secondary | ICD-10-CM | POA: Insufficient documentation

## 2016-08-06 DIAGNOSIS — R922 Inconclusive mammogram: Secondary | ICD-10-CM | POA: Diagnosis not present

## 2016-08-06 DIAGNOSIS — N6489 Other specified disorders of breast: Secondary | ICD-10-CM | POA: Diagnosis not present

## 2016-08-13 ENCOUNTER — Other Ambulatory Visit: Payer: Self-pay | Admitting: Nurse Practitioner

## 2016-08-13 DIAGNOSIS — N6459 Other signs and symptoms in breast: Secondary | ICD-10-CM

## 2016-08-16 DIAGNOSIS — F250 Schizoaffective disorder, bipolar type: Secondary | ICD-10-CM | POA: Diagnosis not present

## 2016-09-07 DIAGNOSIS — D509 Iron deficiency anemia, unspecified: Secondary | ICD-10-CM | POA: Diagnosis not present

## 2016-09-07 DIAGNOSIS — I10 Essential (primary) hypertension: Secondary | ICD-10-CM | POA: Diagnosis not present

## 2016-09-07 DIAGNOSIS — E785 Hyperlipidemia, unspecified: Secondary | ICD-10-CM | POA: Diagnosis not present

## 2016-09-09 DIAGNOSIS — E118 Type 2 diabetes mellitus with unspecified complications: Secondary | ICD-10-CM | POA: Diagnosis not present

## 2016-09-09 DIAGNOSIS — E1165 Type 2 diabetes mellitus with hyperglycemia: Secondary | ICD-10-CM | POA: Diagnosis not present

## 2016-09-09 DIAGNOSIS — I10 Essential (primary) hypertension: Secondary | ICD-10-CM | POA: Diagnosis not present

## 2016-09-09 DIAGNOSIS — E785 Hyperlipidemia, unspecified: Secondary | ICD-10-CM | POA: Diagnosis not present

## 2016-09-09 DIAGNOSIS — D509 Iron deficiency anemia, unspecified: Secondary | ICD-10-CM | POA: Diagnosis not present

## 2016-09-09 DIAGNOSIS — E559 Vitamin D deficiency, unspecified: Secondary | ICD-10-CM | POA: Diagnosis not present

## 2016-09-09 DIAGNOSIS — F419 Anxiety disorder, unspecified: Secondary | ICD-10-CM | POA: Diagnosis not present

## 2016-09-09 DIAGNOSIS — E669 Obesity, unspecified: Secondary | ICD-10-CM | POA: Diagnosis not present

## 2016-09-13 DIAGNOSIS — Z308 Encounter for other contraceptive management: Secondary | ICD-10-CM | POA: Diagnosis not present

## 2016-09-13 DIAGNOSIS — F250 Schizoaffective disorder, bipolar type: Secondary | ICD-10-CM | POA: Diagnosis not present

## 2016-09-13 DIAGNOSIS — Z3202 Encounter for pregnancy test, result negative: Secondary | ICD-10-CM | POA: Diagnosis not present

## 2016-09-14 DIAGNOSIS — F250 Schizoaffective disorder, bipolar type: Secondary | ICD-10-CM | POA: Diagnosis not present

## 2016-09-15 DIAGNOSIS — E1165 Type 2 diabetes mellitus with hyperglycemia: Secondary | ICD-10-CM | POA: Diagnosis not present

## 2016-09-15 DIAGNOSIS — Z794 Long term (current) use of insulin: Secondary | ICD-10-CM | POA: Diagnosis not present

## 2016-10-11 DIAGNOSIS — F250 Schizoaffective disorder, bipolar type: Secondary | ICD-10-CM | POA: Diagnosis not present

## 2016-10-26 DIAGNOSIS — J449 Chronic obstructive pulmonary disease, unspecified: Secondary | ICD-10-CM | POA: Diagnosis not present

## 2016-10-26 DIAGNOSIS — E118 Type 2 diabetes mellitus with unspecified complications: Secondary | ICD-10-CM | POA: Diagnosis not present

## 2016-10-26 DIAGNOSIS — F17209 Nicotine dependence, unspecified, with unspecified nicotine-induced disorders: Secondary | ICD-10-CM | POA: Diagnosis not present

## 2016-10-26 DIAGNOSIS — L258 Unspecified contact dermatitis due to other agents: Secondary | ICD-10-CM | POA: Diagnosis not present

## 2016-11-15 DIAGNOSIS — F250 Schizoaffective disorder, bipolar type: Secondary | ICD-10-CM | POA: Diagnosis not present

## 2016-12-09 DIAGNOSIS — F250 Schizoaffective disorder, bipolar type: Secondary | ICD-10-CM | POA: Diagnosis not present

## 2016-12-14 DIAGNOSIS — F250 Schizoaffective disorder, bipolar type: Secondary | ICD-10-CM | POA: Diagnosis not present

## 2017-01-10 DIAGNOSIS — I10 Essential (primary) hypertension: Secondary | ICD-10-CM | POA: Diagnosis not present

## 2017-01-10 DIAGNOSIS — E118 Type 2 diabetes mellitus with unspecified complications: Secondary | ICD-10-CM | POA: Diagnosis not present

## 2017-01-10 DIAGNOSIS — E559 Vitamin D deficiency, unspecified: Secondary | ICD-10-CM | POA: Diagnosis not present

## 2017-01-10 DIAGNOSIS — D509 Iron deficiency anemia, unspecified: Secondary | ICD-10-CM | POA: Diagnosis not present

## 2017-01-10 DIAGNOSIS — E785 Hyperlipidemia, unspecified: Secondary | ICD-10-CM | POA: Diagnosis not present

## 2017-01-11 DIAGNOSIS — F250 Schizoaffective disorder, bipolar type: Secondary | ICD-10-CM | POA: Diagnosis not present

## 2017-01-12 DIAGNOSIS — Z0001 Encounter for general adult medical examination with abnormal findings: Secondary | ICD-10-CM | POA: Diagnosis not present

## 2017-01-12 DIAGNOSIS — Z01411 Encounter for gynecological examination (general) (routine) with abnormal findings: Secondary | ICD-10-CM | POA: Diagnosis not present

## 2017-01-12 DIAGNOSIS — E669 Obesity, unspecified: Secondary | ICD-10-CM | POA: Diagnosis not present

## 2017-01-12 DIAGNOSIS — Z202 Contact with and (suspected) exposure to infections with a predominantly sexual mode of transmission: Secondary | ICD-10-CM | POA: Diagnosis not present

## 2017-01-12 DIAGNOSIS — E1165 Type 2 diabetes mellitus with hyperglycemia: Secondary | ICD-10-CM | POA: Diagnosis not present

## 2017-01-12 DIAGNOSIS — Z23 Encounter for immunization: Secondary | ICD-10-CM | POA: Diagnosis not present

## 2017-01-12 DIAGNOSIS — Z1272 Encounter for screening for malignant neoplasm of vagina: Secondary | ICD-10-CM | POA: Diagnosis not present

## 2017-01-12 DIAGNOSIS — E785 Hyperlipidemia, unspecified: Secondary | ICD-10-CM | POA: Diagnosis not present

## 2017-01-28 ENCOUNTER — Inpatient Hospital Stay: Payer: Medicare Other | Attending: Internal Medicine

## 2017-01-28 ENCOUNTER — Ambulatory Visit: Payer: Medicare Other | Attending: Internal Medicine

## 2017-02-04 ENCOUNTER — Inpatient Hospital Stay: Payer: Medicare Other | Admitting: Internal Medicine

## 2017-02-07 ENCOUNTER — Ambulatory Visit
Admission: RE | Admit: 2017-02-07 | Discharge: 2017-02-07 | Disposition: A | Discharge status: Home / Self Care | Payer: Medicare Other | Source: Ambulatory Visit | Attending: Nurse Practitioner | Admitting: Nurse Practitioner

## 2017-02-07 DIAGNOSIS — N6321 Unspecified lump in the left breast, upper outer quadrant: Secondary | ICD-10-CM | POA: Insufficient documentation

## 2017-02-07 DIAGNOSIS — N6311 Unspecified lump in the right breast, upper outer quadrant: Secondary | ICD-10-CM | POA: Diagnosis not present

## 2017-02-07 DIAGNOSIS — N6459 Other signs and symptoms in breast: Secondary | ICD-10-CM

## 2017-02-07 DIAGNOSIS — N649 Disorder of breast, unspecified: Secondary | ICD-10-CM | POA: Diagnosis present

## 2017-02-07 DIAGNOSIS — R928 Other abnormal and inconclusive findings on diagnostic imaging of breast: Secondary | ICD-10-CM | POA: Diagnosis not present

## 2017-02-07 DIAGNOSIS — N6489 Other specified disorders of breast: Secondary | ICD-10-CM | POA: Diagnosis not present

## 2017-02-08 ENCOUNTER — Other Ambulatory Visit: Payer: Self-pay | Admitting: Nurse Practitioner

## 2017-02-08 DIAGNOSIS — N63 Unspecified lump in unspecified breast: Secondary | ICD-10-CM

## 2017-02-09 DIAGNOSIS — F250 Schizoaffective disorder, bipolar type: Secondary | ICD-10-CM | POA: Diagnosis not present

## 2017-02-14 DIAGNOSIS — F250 Schizoaffective disorder, bipolar type: Secondary | ICD-10-CM | POA: Diagnosis not present

## 2017-02-14 DIAGNOSIS — E1165 Type 2 diabetes mellitus with hyperglycemia: Secondary | ICD-10-CM | POA: Diagnosis not present

## 2017-02-15 ENCOUNTER — Telehealth: Payer: Self-pay

## 2017-02-16 NOTE — Telephone Encounter (Signed)
Called patient and spoke with her caregiver since she is deaf and mute. I told her that I was calling Ms. Traci Pena because she needed to have a CT Scan and a follow up appointment with Dr. Thelma Bargeaks in reference to her lung nodule. I told her that I would send her the appointment information by mail so she knows where she needs to be. I also told her that I would request an interpreter for the patient.

## 2017-02-22 ENCOUNTER — Ambulatory Visit
Admission: RE | Admit: 2017-02-22 | Discharge: 2017-02-22 | Disposition: A | Discharge status: Home / Self Care | Payer: Medicare Other | Source: Ambulatory Visit | Attending: Cardiothoracic Surgery | Admitting: Cardiothoracic Surgery

## 2017-02-22 DIAGNOSIS — J984 Other disorders of lung: Secondary | ICD-10-CM | POA: Insufficient documentation

## 2017-02-22 DIAGNOSIS — E559 Vitamin D deficiency, unspecified: Secondary | ICD-10-CM | POA: Diagnosis not present

## 2017-02-22 DIAGNOSIS — F17200 Nicotine dependence, unspecified, uncomplicated: Secondary | ICD-10-CM | POA: Insufficient documentation

## 2017-02-22 DIAGNOSIS — E118 Type 2 diabetes mellitus with unspecified complications: Secondary | ICD-10-CM | POA: Diagnosis not present

## 2017-02-22 DIAGNOSIS — D509 Iron deficiency anemia, unspecified: Secondary | ICD-10-CM | POA: Diagnosis not present

## 2017-02-22 DIAGNOSIS — R911 Solitary pulmonary nodule: Secondary | ICD-10-CM

## 2017-02-22 DIAGNOSIS — I10 Essential (primary) hypertension: Secondary | ICD-10-CM | POA: Diagnosis not present

## 2017-02-22 DIAGNOSIS — E785 Hyperlipidemia, unspecified: Secondary | ICD-10-CM | POA: Diagnosis not present

## 2017-02-25 ENCOUNTER — Encounter: Payer: Self-pay | Admitting: Cardiothoracic Surgery

## 2017-02-25 ENCOUNTER — Ambulatory Visit (INDEPENDENT_AMBULATORY_CARE_PROVIDER_SITE_OTHER): Payer: Medicare Other | Admitting: Cardiothoracic Surgery

## 2017-02-25 VITALS — BP 108/72 | HR 91 | Temp 98.0°F | Ht 64.0 in | Wt 218.0 lb

## 2017-02-25 DIAGNOSIS — R911 Solitary pulmonary nodule: Secondary | ICD-10-CM

## 2017-02-25 NOTE — Progress Notes (Signed)
  Patient ID: Traci Pena, female   DOB: 04/21/1970, 46 y.o.   MRN: [id number]  HISTORY: She returns today in follow-up.  She was accompanied by 1 of her caregivers and also the side-lying interpreter.  Her only complaints is that she has had some back pain and some right lower quadrant pain secondary to working very strenuously.  She has no complaints of shortness of breath.  Unfortunately she does continue to smoke.   Vitals:   02/25/17 0924  BP: 108/72  Pulse: 91  Temp: 98 F (36.7 C)  SpO2: 98%     EXAM:    Resp: Lungs are clear bilaterally.  No respiratory distress, normal effort. Heart:  Regular without murmurs Abd:  Abdomen is soft, non distended and non tender. No masses are palpable.  There is no rebound and no guarding.  Neurological: Alert and oriented to person, place, and time. Coordination normal.  Skin: Skin is warm and dry. No rash noted. No diaphoretic. No erythema. No pallor.  Psychiatric: Normal mood and affect. Normal behavior. Judgment and thought content normal.    ASSESSMENT: I have independently reviewed the patient's CT scan.  The left lower lobe nodule is now gone.  The right lower lobe nodule which measures about 4 mm has been stable for 2 years.  No additional follow-up was recommended.   PLAN:   I told her that I have counseled her on smoking cessation.  I reviewed with her the results of the CT scan.  No additional follow-up was made at this time.    Hulda Marinimothy Shantale Holtmeyer, MD

## 2017-02-25 NOTE — Patient Instructions (Signed)
Please give us a call in case you have any questions or concerns.  

## 2017-03-08 DIAGNOSIS — F17200 Nicotine dependence, unspecified, uncomplicated: Secondary | ICD-10-CM | POA: Diagnosis not present

## 2017-03-08 DIAGNOSIS — Z6839 Body mass index (BMI) 39.0-39.9, adult: Secondary | ICD-10-CM | POA: Diagnosis not present

## 2017-03-08 DIAGNOSIS — E1165 Type 2 diabetes mellitus with hyperglycemia: Secondary | ICD-10-CM | POA: Diagnosis not present

## 2017-03-14 DIAGNOSIS — F250 Schizoaffective disorder, bipolar type: Secondary | ICD-10-CM | POA: Diagnosis not present

## 2017-04-18 DIAGNOSIS — F250 Schizoaffective disorder, bipolar type: Secondary | ICD-10-CM | POA: Diagnosis not present

## 2017-04-19 DIAGNOSIS — Z308 Encounter for other contraceptive management: Secondary | ICD-10-CM | POA: Diagnosis not present

## 2017-04-27 DIAGNOSIS — F250 Schizoaffective disorder, bipolar type: Secondary | ICD-10-CM | POA: Diagnosis not present

## 2017-05-11 DIAGNOSIS — J441 Chronic obstructive pulmonary disease with (acute) exacerbation: Secondary | ICD-10-CM | POA: Diagnosis not present

## 2017-05-11 DIAGNOSIS — R0600 Dyspnea, unspecified: Secondary | ICD-10-CM | POA: Diagnosis not present

## 2017-05-11 DIAGNOSIS — F209 Schizophrenia, unspecified: Secondary | ICD-10-CM | POA: Diagnosis not present

## 2017-05-11 DIAGNOSIS — E118 Type 2 diabetes mellitus with unspecified complications: Secondary | ICD-10-CM | POA: Diagnosis not present

## 2017-05-11 DIAGNOSIS — F17209 Nicotine dependence, unspecified, with unspecified nicotine-induced disorders: Secondary | ICD-10-CM | POA: Diagnosis not present

## 2017-05-11 DIAGNOSIS — I10 Essential (primary) hypertension: Secondary | ICD-10-CM | POA: Diagnosis not present

## 2017-05-16 DIAGNOSIS — F250 Schizoaffective disorder, bipolar type: Secondary | ICD-10-CM | POA: Diagnosis not present

## 2017-06-08 DIAGNOSIS — E118 Type 2 diabetes mellitus with unspecified complications: Secondary | ICD-10-CM | POA: Diagnosis not present

## 2017-06-08 DIAGNOSIS — F17200 Nicotine dependence, unspecified, uncomplicated: Secondary | ICD-10-CM | POA: Diagnosis not present

## 2017-06-08 DIAGNOSIS — E785 Hyperlipidemia, unspecified: Secondary | ICD-10-CM | POA: Diagnosis not present

## 2017-06-08 DIAGNOSIS — E1165 Type 2 diabetes mellitus with hyperglycemia: Secondary | ICD-10-CM | POA: Diagnosis not present

## 2017-06-08 DIAGNOSIS — I10 Essential (primary) hypertension: Secondary | ICD-10-CM | POA: Diagnosis not present

## 2017-06-13 DIAGNOSIS — F250 Schizoaffective disorder, bipolar type: Secondary | ICD-10-CM | POA: Diagnosis not present

## 2017-06-15 DIAGNOSIS — E1165 Type 2 diabetes mellitus with hyperglycemia: Secondary | ICD-10-CM | POA: Diagnosis not present

## 2017-06-15 DIAGNOSIS — J449 Chronic obstructive pulmonary disease, unspecified: Secondary | ICD-10-CM | POA: Diagnosis not present

## 2017-06-15 DIAGNOSIS — I10 Essential (primary) hypertension: Secondary | ICD-10-CM | POA: Diagnosis not present

## 2017-06-15 DIAGNOSIS — D509 Iron deficiency anemia, unspecified: Secondary | ICD-10-CM | POA: Diagnosis not present

## 2017-06-15 DIAGNOSIS — E785 Hyperlipidemia, unspecified: Secondary | ICD-10-CM | POA: Diagnosis not present

## 2017-07-11 DIAGNOSIS — F250 Schizoaffective disorder, bipolar type: Secondary | ICD-10-CM | POA: Diagnosis not present

## 2017-08-04 DIAGNOSIS — F250 Schizoaffective disorder, bipolar type: Secondary | ICD-10-CM | POA: Diagnosis not present

## 2017-08-09 ENCOUNTER — Ambulatory Visit
Admission: RE | Admit: 2017-08-09 | Discharge: 2017-08-09 | Disposition: A | Discharge status: Home / Self Care | Payer: Medicare Other | Source: Ambulatory Visit | Attending: Nurse Practitioner | Admitting: Nurse Practitioner

## 2017-08-09 DIAGNOSIS — N6001 Solitary cyst of right breast: Secondary | ICD-10-CM | POA: Diagnosis not present

## 2017-08-09 DIAGNOSIS — N63 Unspecified lump in unspecified breast: Secondary | ICD-10-CM | POA: Diagnosis present

## 2017-08-09 DIAGNOSIS — R928 Other abnormal and inconclusive findings on diagnostic imaging of breast: Secondary | ICD-10-CM | POA: Diagnosis not present

## 2017-08-09 DIAGNOSIS — N6002 Solitary cyst of left breast: Secondary | ICD-10-CM | POA: Diagnosis not present

## 2017-08-09 DIAGNOSIS — N6313 Unspecified lump in the right breast, lower outer quadrant: Secondary | ICD-10-CM | POA: Diagnosis not present

## 2017-08-09 DIAGNOSIS — R921 Mammographic calcification found on diagnostic imaging of breast: Secondary | ICD-10-CM | POA: Insufficient documentation

## 2017-08-09 DIAGNOSIS — N6012 Diffuse cystic mastopathy of left breast: Secondary | ICD-10-CM | POA: Diagnosis not present

## 2017-08-09 DIAGNOSIS — N6311 Unspecified lump in the right breast, upper outer quadrant: Secondary | ICD-10-CM | POA: Diagnosis not present

## 2017-08-10 ENCOUNTER — Other Ambulatory Visit: Payer: Self-pay | Admitting: Nurse Practitioner

## 2017-08-10 DIAGNOSIS — N63 Unspecified lump in unspecified breast: Secondary | ICD-10-CM

## 2017-08-10 DIAGNOSIS — Z1231 Encounter for screening mammogram for malignant neoplasm of breast: Secondary | ICD-10-CM

## 2017-08-16 DIAGNOSIS — F250 Schizoaffective disorder, bipolar type: Secondary | ICD-10-CM | POA: Diagnosis not present

## 2017-08-24 ENCOUNTER — Encounter (HOSPITAL_COMMUNITY): Payer: Self-pay | Admitting: Emergency Medicine

## 2017-08-24 ENCOUNTER — Emergency Department (HOSPITAL_COMMUNITY)
Admission: EM | Admit: 2017-08-24 | Discharge: 2017-08-24 | Disposition: A | Discharge status: Home / Self Care | Payer: Medicare Other | Attending: Emergency Medicine | Admitting: Emergency Medicine

## 2017-08-24 ENCOUNTER — Emergency Department (HOSPITAL_COMMUNITY): Payer: Medicare Other

## 2017-08-24 ENCOUNTER — Other Ambulatory Visit: Payer: Self-pay

## 2017-08-24 DIAGNOSIS — Z794 Long term (current) use of insulin: Secondary | ICD-10-CM | POA: Diagnosis not present

## 2017-08-24 DIAGNOSIS — Z79899 Other long term (current) drug therapy: Secondary | ICD-10-CM | POA: Diagnosis not present

## 2017-08-24 DIAGNOSIS — F17210 Nicotine dependence, cigarettes, uncomplicated: Secondary | ICD-10-CM | POA: Diagnosis not present

## 2017-08-24 DIAGNOSIS — R4020 Unspecified coma: Secondary | ICD-10-CM | POA: Diagnosis not present

## 2017-08-24 DIAGNOSIS — R4182 Altered mental status, unspecified: Secondary | ICD-10-CM | POA: Insufficient documentation

## 2017-08-24 DIAGNOSIS — E1165 Type 2 diabetes mellitus with hyperglycemia: Secondary | ICD-10-CM | POA: Diagnosis not present

## 2017-08-24 DIAGNOSIS — Z7982 Long term (current) use of aspirin: Secondary | ICD-10-CM | POA: Diagnosis not present

## 2017-08-24 DIAGNOSIS — I10 Essential (primary) hypertension: Secondary | ICD-10-CM | POA: Diagnosis not present

## 2017-08-24 DIAGNOSIS — E119 Type 2 diabetes mellitus without complications: Secondary | ICD-10-CM | POA: Insufficient documentation

## 2017-08-24 DIAGNOSIS — R402441 Other coma, without documented Glasgow coma scale score, or with partial score reported, in the field [EMT or ambulance]: Secondary | ICD-10-CM | POA: Diagnosis not present

## 2017-08-24 DIAGNOSIS — R404 Transient alteration of awareness: Secondary | ICD-10-CM

## 2017-08-24 DIAGNOSIS — R0902 Hypoxemia: Secondary | ICD-10-CM | POA: Diagnosis not present

## 2017-08-24 LAB — COMPREHENSIVE METABOLIC PANEL
ALT: 13 U/L — ABNORMAL LOW (ref 14–54)
AST: 16 U/L (ref 15–41)
Albumin: 3.7 g/dL (ref 3.5–5.0)
Alkaline Phosphatase: 71 U/L (ref 38–126)
Anion gap: 8 (ref 5–15)
BUN: 9 mg/dL (ref 6–20)
CHLORIDE: 104 mmol/L (ref 101–111)
CO2: 26 mmol/L (ref 22–32)
CREATININE: 0.59 mg/dL (ref 0.44–1.00)
Calcium: 9.2 mg/dL (ref 8.9–10.3)
Glucose, Bld: 194 mg/dL — ABNORMAL HIGH (ref 65–99)
POTASSIUM: 3.7 mmol/L (ref 3.5–5.1)
Sodium: 138 mmol/L (ref 135–145)
TOTAL PROTEIN: 7.4 g/dL (ref 6.5–8.1)
Total Bilirubin: 0.5 mg/dL (ref 0.3–1.2)

## 2017-08-24 LAB — I-STAT BETA HCG BLOOD, ED (MC, WL, AP ONLY)

## 2017-08-24 LAB — CBC WITH DIFFERENTIAL/PLATELET
BASOS ABS: 0 10*3/uL (ref 0.0–0.1)
Basophils Relative: 0 %
EOS ABS: 0.3 10*3/uL (ref 0.0–0.7)
EOS PCT: 2 %
HCT: 34.9 % — ABNORMAL LOW (ref 36.0–46.0)
HEMOGLOBIN: 11 g/dL — AB (ref 12.0–15.0)
LYMPHS ABS: 3.9 10*3/uL (ref 0.7–4.0)
LYMPHS PCT: 33 %
MCH: 23.4 pg — AB (ref 26.0–34.0)
MCHC: 31.5 g/dL (ref 30.0–36.0)
MCV: 74.3 fL — ABNORMAL LOW (ref 78.0–100.0)
Monocytes Absolute: 0.5 10*3/uL (ref 0.1–1.0)
Monocytes Relative: 4 %
Neutro Abs: 7.3 10*3/uL (ref 1.7–7.7)
Neutrophils Relative %: 61 %
PLATELETS: 443 10*3/uL — AB (ref 150–400)
RBC: 4.7 MIL/uL (ref 3.87–5.11)
RDW: 17.8 % — ABNORMAL HIGH (ref 11.5–15.5)
WBC: 12 10*3/uL — AB (ref 4.0–10.5)

## 2017-08-24 LAB — LITHIUM LEVEL: LITHIUM LVL: 0.33 mmol/L — AB (ref 0.60–1.20)

## 2017-08-24 LAB — CBG MONITORING, ED: GLUCOSE-CAPILLARY: 217 mg/dL — AB (ref 65–99)

## 2017-08-24 MED ORDER — SODIUM CHLORIDE 0.9 % IV BOLUS
1000.0000 mL | Freq: Once | INTRAVENOUS | Status: AC
  Administered 2017-08-24: 1000 mL via INTRAVENOUS

## 2017-08-24 MED ORDER — SODIUM CHLORIDE 0.9 % IV SOLN
INTRAVENOUS | Status: DC
  Administered 2017-08-24: 13:00:00 via INTRAVENOUS

## 2017-08-24 NOTE — ED Notes (Signed)
Have applied Pure Wick to pt. Tried I&O. Pt has a prolapsed uterus, so unsuccessful attempt

## 2017-08-24 NOTE — ED Triage Notes (Signed)
PT from a group home in [facility] and brought in by EMS today. PT is lethargic but able to wake up with arousal. PT is deaf and communicates by sign language or can write simple states on paper. PT c/o blurry vision in left eye today.

## 2017-08-24 NOTE — ED Notes (Signed)
Sign Language Services done by [facility] [phone]

## 2017-08-24 NOTE — ED Provider Notes (Signed)
[facility] EMERGENCY DEPARTMENT Provider Note   CSN: [id number] Arrival date & time: 08/24/17  1236     History   Chief Complaint Chief Complaint  Patient presents with  . Altered Mental Status    HPI Traci Pena is a 47 y.o. female.  HPI  Patient presents from a nursing facility with staff concerns of altered mental status.  The patient is deaf, and per report was found altered just prior to calling EMS. Level 5 caveat secondary to altered mental status. Per report the patient was in her usual state of health, and has recently been observed to be behaving in a total normal manner, including going outside to smoke, ambulatory. However, just prior to calling EMS the patient was found listless. There is some suspicion of change in insulin dosing and mealtimes, though this is reported by EMS, as reported to them, and it is unclear. No report of fall, fever, vomiting, diarrhea, incontinence.   Past Medical History:  Diagnosis Date  . Chronic constipation   . Depression   . Diabetes mellitus without complication (HCC)   . Gastritis   . GERD (gastroesophageal reflux disease)   . Hypertension     Patient Active Problem List   Diagnosis Date Noted  . Multiple lung nodules 02/05/2016  . Iron deficiency anemia due to chronic blood loss 02/07/2015  . Bilateral deafness 05/17/2014  . Long term current use of insulin (HCC) 05/17/2014  . Type 2 diabetes mellitus (HCC) 05/17/2014  . Cough 10/23/2013  . Current tobacco use 10/23/2013    Past Surgical History:  Procedure Laterality Date  . COLONOSCOPY N/A 09/06/2014   Procedure: COLONOSCOPY;  Surgeon: Elnita Maxwell, MD;  Location: [facility] ENDOSCOPY;  Service: Endoscopy;  Laterality: N/A;     OB History   None      Home Medications    Prior to Admission medications   Medication Sig Start Date End Date Taking? Authorizing Provider  albuterol (PROVENTIL HFA;VENTOLIN HFA) 108 (90 BASE) MCG/ACT inhaler Inhale 2 puffs  into the lungs every 6 (six) hours as needed for wheezing or shortness of breath.    [provider]  aspirin EC 81 MG tablet Take 81 mg by mouth daily.    [provider]  atenolol (TENORMIN) 25 MG tablet Take by mouth daily.    [provider]  benztropine (COGENTIN) 1 MG tablet Take 1 mg by mouth 2 (two) times daily.    [provider]  budesonide-formoterol (SYMBICORT) 160-4.5 MCG/ACT inhaler Inhale 2 puffs into the lungs 2 (two) times daily.    [provider]  cetirizine (ZYRTEC) 10 MG tablet Take 10 mg by mouth daily.    [provider]  clonazePAM (KLONOPIN) 0.5 MG tablet Take 0.5 mg by mouth at bedtime.    [provider]  cloZAPine (CLOZARIL) 100 MG tablet Take 1 tablet in the morning and 2 tablets before bed    [provider]  fluticasone (FLONASE) 50 MCG/ACT nasal spray Place 1 spray into both nostrils daily.    [provider]  insulin aspart (NOVOLOG FLEXPEN) 100 UNIT/ML FlexPen Inject 8 Units into the skin 3 (three) times daily with meals.    [provider]  Insulin Glargine (TOUJEO SOLOSTAR) 300 UNIT/ML SOPN Inject into the skin.    [provider]  insulin lispro (HUMALOG) 100 UNIT/ML KiwkPen Inject into the skin.    [provider]  iron polysaccharides (NIFEREX) 150 MG capsule Take 150 mg by mouth daily.  [provider]  lisinopril (PRINIVIL,ZESTRIL) 10 MG tablet Take 5 mg by mouth daily.    [provider]  lithium 600 MG capsule Take 600 mg by mouth at bedtime.    [provider]  medroxyPROGESTERone (DEPO-PROVERA) 150 MG/ML injection Inject 150 mg into the muscle every 3 (three) months.    [provider]  metFORMIN (GLUCOPHAGE) 1000 MG tablet Take 1,000 mg by mouth 2 (two) times daily with a meal.    [provider]  montelukast (SINGULAIR) 10 MG tablet Take 10 mg by mouth at bedtime.    [provider]    Multiple Vitamin (MULTIVITAMIN) tablet Take 1 tablet by mouth daily.    [provider]  omeprazole (PRILOSEC) 40 MG capsule Take 40 mg by mouth daily.    [provider]  sertraline (ZOLOFT) 50 MG tablet Take 50 mg by mouth daily.    [provider]  simvastatin (ZOCOR) 20 MG tablet Take by mouth.    [provider]  tiotropium (SPIRIVA) 18 MCG inhalation capsule Place 18 mcg into inhaler and inhale daily.    [provider]    Family History History reviewed. No pertinent family history.  Social History Social History   Tobacco Use  . Smoking status: Current Every Day Smoker    Packs/day: 4.00    Years: 3.00    Pack years: 12.00    Types: Cigarettes  . Smokeless tobacco: Never Used  Substance Use Topics  . Alcohol use: No    Alcohol/week: 0.0 oz  . Drug use: No     Allergies   Patient has no known allergies.   Review of Systems Review of Systems  Unable to perform ROS: Mental status change     Physical Exam Updated Vital Signs BP 127/75   Pulse 81   Resp (!) 22   Ht  (1.651 m)   Wt 99.8 kg (220 lb)   SpO2 97%   BMI 36.61 kg/m   Physical Exam  Constitutional: She appears well-developed and well-nourished. She appears listless.  HENT:  Head: Normocephalic and atraumatic.  Eyes: Conjunctivae and EOM are normal.  Cardiovascular: Normal rate and regular rhythm.  Pulmonary/Chest: Effort normal and breath sounds normal. No stridor. No respiratory distress.  Abdominal: She exhibits no distension.  Musculoskeletal: She exhibits no edema.  Neurological: She appears listless.  Patient does not participate in initial neurologic exam, does not seem to respond to painful stimuli with motion of her extremities.  Skin: Skin is warm and dry.  Psychiatric: Cognition and memory are impaired.  Nursing note and vitals reviewed.    ED Treatments / Results  Labs (all labs ordered are listed, but only abnormal results  are displayed) Labs Reviewed  COMPREHENSIVE METABOLIC PANEL - Abnormal; Notable for the following components:      Result Value   Glucose, Bld 194 (*)    ALT 13 (*)    All other components within normal limits  CBC WITH DIFFERENTIAL/PLATELET - Abnormal; Notable for the following components:   WBC 12.0 (*)    Hemoglobin 11.0 (*)    HCT 34.9 (*)    MCV 74.3 (*)    MCH 23.4 (*)    RDW 17.8 (*)    Platelets 443 (*)    All other components within normal limits  LITHIUM LEVEL - Abnormal; Notable for the following components:   Lithium Lvl 0.33 (*)    All other components within normal limits  CBG MONITORING, ED -  Abnormal; Notable for the following components:   Glucose-Capillary 217 (*)    All other components within normal limits  I-STAT BETA HCG BLOOD, ED ([facility], [facility], [facility] ONLY)    EKG EKG Interpretation  Date/Time:  Wednesday Aug 24 2017 12:50:18 EDT Ventricular Rate:  71 PR Interval:    QRS Duration: 80 QT Interval:  378 QTC Calculation: 411 R Axis:   61 Text Interpretation:  Sinus rhythm Confirmed by Gerhard Munch [phone]) on 08/24/2017 1:02:28 PM   Radiology Dg Chest Port 1 View  Result Date: 08/24/2017 CLINICAL DATA:  Altered level of consciousness EXAM: PORTABLE CHEST 1 VIEW COMPARISON:  CT chest of 06/07/2014 FINDINGS: The lungs are very poorly aerated with volume loss at the lung bases. No definite pneumonia is seen and no obvious effusion is evident. Mediastinal and hilar contours are unremarkable. The heart is within upper limits of normal. No bony abnormality is seen. IMPRESSION: Poor inspiration chest x-ray with mild volume loss at the lung bases. No definite active process. Electronically Signed   By: Dwyane Dee M.D.   On: 08/24/2017 13:28    Procedures Procedures (including critical care time)  Medications Ordered in ED Medications  sodium chloride 0.9 % bolus 1,000 mL (1,000 mLs Intravenous New Bag/Given 08/24/17 1322)    And  0.9 %  sodium chloride infusion (  Intravenous New Bag/Given 08/24/17 1322)     Initial Impression / Assessment and Plan / ED Course  I have reviewed the triage vital signs and the nursing notes.  Pertinent labs & imaging results that were available during my care of the patient were reviewed by me and considered in my medical decision making (see chart for details).     1:29 PM Patient awake and alert. Via translator, she states that she is very sleepy sometimes, and though she was snoring, listless on arrival, she has improved substantially, now is keeping her head upright, speaking via sign language consistently. She states that she does have some difficulty with sleep awake balance, denies other complaints, states that she feels fine  2:31 PM Patient now accompanied by group home staff, notes the patient is interacting in a typical manner, and the patient herself continues to deny complaints, requests discharge. Labs reassuring aside from subtherapeutic lithium, which the patient and her staff member are aware of. Patient does not yet have urinalysis, but has no urinary complaints, prefers discharge to waiting for additional value. The patient came in listless, she now describes having not slept well yesterday, feels as though she was sleeping, and there is some suspicion for the patient's deafness complicating to her evaluation at the nursing facility. Patient's valuation here reassuring, no evidence for bacteremia, sepsis, substantial electrolyte abnormalities, and given her denial of ongoing complaints, she was discharged in stable condition.  Final Clinical Impressions(s) / ED Diagnoses  Altered mental status   Gerhard Munch, MD 08/24/17 1432

## 2017-08-24 NOTE — Discharge Instructions (Addendum)
As discussed, your evaluation today has been largely reassuring.  But, it is important that you monitor your condition carefully, and do not hesitate to return to the ED if you develop new, or concerning changes in your condition.  As discussed your lithium dosing with your physician.  Otherwise, please follow-up with your physician for appropriate ongoing care.

## 2017-08-31 ENCOUNTER — Ambulatory Visit
Admission: RE | Admit: 2017-08-31 | Discharge: 2017-08-31 | Disposition: A | Discharge status: Home / Self Care | Payer: Medicare Other | Source: Ambulatory Visit | Attending: Nurse Practitioner | Admitting: Nurse Practitioner

## 2017-08-31 DIAGNOSIS — Z1231 Encounter for screening mammogram for malignant neoplasm of breast: Secondary | ICD-10-CM | POA: Insufficient documentation

## 2017-08-31 DIAGNOSIS — N63 Unspecified lump in unspecified breast: Secondary | ICD-10-CM | POA: Insufficient documentation

## 2017-08-31 DIAGNOSIS — E1165 Type 2 diabetes mellitus with hyperglycemia: Secondary | ICD-10-CM | POA: Diagnosis not present

## 2017-09-08 DIAGNOSIS — Z3042 Encounter for surveillance of injectable contraceptive: Secondary | ICD-10-CM | POA: Diagnosis not present

## 2017-09-08 DIAGNOSIS — F17200 Nicotine dependence, unspecified, uncomplicated: Secondary | ICD-10-CM | POA: Diagnosis not present

## 2017-09-08 DIAGNOSIS — Z6839 Body mass index (BMI) 39.0-39.9, adult: Secondary | ICD-10-CM | POA: Diagnosis not present

## 2017-09-08 DIAGNOSIS — E119 Type 2 diabetes mellitus without complications: Secondary | ICD-10-CM | POA: Diagnosis not present

## 2017-09-08 DIAGNOSIS — Z794 Long term (current) use of insulin: Secondary | ICD-10-CM | POA: Diagnosis not present

## 2017-09-13 DIAGNOSIS — F250 Schizoaffective disorder, bipolar type: Secondary | ICD-10-CM | POA: Diagnosis not present

## 2017-10-12 DIAGNOSIS — F250 Schizoaffective disorder, bipolar type: Secondary | ICD-10-CM | POA: Diagnosis not present

## 2017-11-14 DIAGNOSIS — F250 Schizoaffective disorder, bipolar type: Secondary | ICD-10-CM | POA: Diagnosis not present

## 2017-11-23 DIAGNOSIS — F250 Schizoaffective disorder, bipolar type: Secondary | ICD-10-CM | POA: Diagnosis not present

## 2017-12-06 DIAGNOSIS — E119 Type 2 diabetes mellitus without complications: Secondary | ICD-10-CM | POA: Diagnosis not present

## 2017-12-06 DIAGNOSIS — Z794 Long term (current) use of insulin: Secondary | ICD-10-CM | POA: Diagnosis not present

## 2017-12-13 DIAGNOSIS — E119 Type 2 diabetes mellitus without complications: Secondary | ICD-10-CM | POA: Diagnosis not present

## 2017-12-13 DIAGNOSIS — F250 Schizoaffective disorder, bipolar type: Secondary | ICD-10-CM | POA: Diagnosis not present

## 2017-12-13 DIAGNOSIS — E669 Obesity, unspecified: Secondary | ICD-10-CM | POA: Diagnosis not present

## 2017-12-13 DIAGNOSIS — Z6839 Body mass index (BMI) 39.0-39.9, adult: Secondary | ICD-10-CM | POA: Diagnosis not present

## 2017-12-13 DIAGNOSIS — I10 Essential (primary) hypertension: Secondary | ICD-10-CM | POA: Diagnosis not present

## 2017-12-13 DIAGNOSIS — E1165 Type 2 diabetes mellitus with hyperglycemia: Secondary | ICD-10-CM | POA: Diagnosis not present

## 2017-12-13 DIAGNOSIS — F17200 Nicotine dependence, unspecified, uncomplicated: Secondary | ICD-10-CM | POA: Diagnosis not present

## 2017-12-13 DIAGNOSIS — Z794 Long term (current) use of insulin: Secondary | ICD-10-CM | POA: Diagnosis not present

## 2017-12-13 DIAGNOSIS — D509 Iron deficiency anemia, unspecified: Secondary | ICD-10-CM | POA: Diagnosis not present

## 2017-12-13 DIAGNOSIS — E1169 Type 2 diabetes mellitus with other specified complication: Secondary | ICD-10-CM | POA: Diagnosis not present

## 2017-12-13 DIAGNOSIS — J449 Chronic obstructive pulmonary disease, unspecified: Secondary | ICD-10-CM | POA: Diagnosis not present

## 2017-12-27 DIAGNOSIS — E119 Type 2 diabetes mellitus without complications: Secondary | ICD-10-CM | POA: Diagnosis not present

## 2018-01-06 DIAGNOSIS — E785 Hyperlipidemia, unspecified: Secondary | ICD-10-CM | POA: Diagnosis not present

## 2018-01-06 DIAGNOSIS — J449 Chronic obstructive pulmonary disease, unspecified: Secondary | ICD-10-CM | POA: Diagnosis not present

## 2018-01-06 DIAGNOSIS — D509 Iron deficiency anemia, unspecified: Secondary | ICD-10-CM | POA: Diagnosis not present

## 2018-01-06 DIAGNOSIS — E1169 Type 2 diabetes mellitus with other specified complication: Secondary | ICD-10-CM | POA: Diagnosis not present

## 2018-01-06 DIAGNOSIS — I10 Essential (primary) hypertension: Secondary | ICD-10-CM | POA: Diagnosis not present

## 2018-01-06 DIAGNOSIS — E669 Obesity, unspecified: Secondary | ICD-10-CM | POA: Diagnosis not present

## 2018-01-06 DIAGNOSIS — E1165 Type 2 diabetes mellitus with hyperglycemia: Secondary | ICD-10-CM | POA: Diagnosis not present

## 2018-01-06 DIAGNOSIS — Z23 Encounter for immunization: Secondary | ICD-10-CM | POA: Diagnosis not present

## 2018-01-09 ENCOUNTER — Other Ambulatory Visit: Payer: Self-pay | Admitting: Nurse Practitioner

## 2018-01-09 DIAGNOSIS — Z1231 Encounter for screening mammogram for malignant neoplasm of breast: Secondary | ICD-10-CM

## 2018-01-11 DIAGNOSIS — F250 Schizoaffective disorder, bipolar type: Secondary | ICD-10-CM | POA: Diagnosis not present

## 2018-01-30 ENCOUNTER — Other Ambulatory Visit: Payer: Self-pay

## 2018-01-31 ENCOUNTER — Ambulatory Visit (INDEPENDENT_AMBULATORY_CARE_PROVIDER_SITE_OTHER): Payer: Medicare Other | Admitting: Podiatry

## 2018-01-31 DIAGNOSIS — M79674 Pain in right toe(s): Secondary | ICD-10-CM | POA: Diagnosis not present

## 2018-01-31 DIAGNOSIS — L84 Corns and callosities: Secondary | ICD-10-CM | POA: Diagnosis not present

## 2018-01-31 DIAGNOSIS — M79675 Pain in left toe(s): Secondary | ICD-10-CM

## 2018-01-31 DIAGNOSIS — E1142 Type 2 diabetes mellitus with diabetic polyneuropathy: Secondary | ICD-10-CM | POA: Diagnosis not present

## 2018-01-31 DIAGNOSIS — B351 Tinea unguium: Secondary | ICD-10-CM

## 2018-01-31 DIAGNOSIS — B353 Tinea pedis: Secondary | ICD-10-CM

## 2018-01-31 MED ORDER — CLOTRIMAZOLE 1 % EX CREA: 1.0000 "application " | TOPICAL_CREAM | Freq: Two times a day (BID) | CUTANEOUS | 0 refills | Status: DC

## 2018-01-31 NOTE — Patient Instructions (Addendum)
Athlete's Foot Athlete's foot (tinea pedis) is a fungal infection of the skin on the feet. It often occurs on the skin that is between or underneath the toes. It can also occur on the soles of the feet. The infection can spread from person to person (is contagious). Follow these instructions at home:  Apply or take over-the-counter and prescription medicines only as told by your doctor.  Keep all follow-up visits as told by your doctor. This is important.  Do not scratch your feet.  Keep your feet dry: ? Wear cotton or wool socks. Change your socks every day or if they become wet. ? Wear shoes that allow air to move around, such as sandals or canvas tennis shoes.  Wash and dry your feet: ? Every day or as told by your doctor. ? After exercising. ? Including the area between your toes.  Wear sandals in wet areas, such as locker rooms and shared showers.  Do not share any of these items: ? Towels. ? Nail clippers. ? Other personal items that touch your feet.  If you have diabetes, keep your blood sugar under control. Contact a doctor if:  You have a fever.  You have swelling, soreness, warmth, or redness in your foot.  You are not getting better with treatment.  Your symptoms get worse.  You have new symptoms. This information is not intended to replace advice given to you by your health care provider. Make sure you discuss any questions you have with your health care provider. Document Released: 09/15/2007 Document Revised: 09/04/2015 Document Reviewed: 09/30/2014 Elsevier Interactive Patient Education  2018 [employer] Diabetes and Foot Care Diabetes may cause you to have problems because of poor blood supply (circulation) to your feet and legs. This may cause the skin on your feet to become thinner, break easier, and heal more slowly. Your skin may become dry, and the skin may peel and crack. You may also have nerve damage in your legs and feet causing decreased feeling  in them. You may not notice minor injuries to your feet that could lead to infections or more serious problems. Taking care of your feet is one of the most important things you can do for yourself. Follow these instructions at home:  Wear shoes at all times, even in the house. Do not go barefoot. Bare feet are easily injured.  Check your feet daily for blisters, cuts, and redness. If you cannot see the bottom of your feet, use a mirror or ask someone for help.  Wash your feet with warm water (do not use hot water) and mild soap. Then pat your feet and the areas between your toes until they are completely dry. Do not soak your feet as this can dry your skin.  Apply a moisturizing lotion or petroleum jelly (that does not contain alcohol and is unscented) to the skin on your feet and to dry, brittle toenails. Do not apply lotion between your toes.  Trim your toenails straight across. Do not dig under them or around the cuticle. File the edges of your nails with an emery board or nail file.  Do not cut corns or calluses or try to remove them with medicine.  Wear clean socks or stockings every day. Make sure they are not too tight. Do not wear knee-high stockings since they may decrease blood flow to your legs.  Wear shoes that fit properly and have enough cushioning. To break in new shoes, wear them for just a few hours   a day. This prevents you from injuring your feet. Always look in your shoes before you put them on to be sure there are no objects inside.  Do not cross your legs. This may decrease the blood flow to your feet.  If you find a minor scrape, cut, or break in the skin on your feet, keep it and the skin around it clean and dry. These areas may be cleansed with mild soap and water. Do not cleanse the area with peroxide, alcohol, or iodine.  When you remove an adhesive bandage, be sure not to damage the skin around it.  If you have a wound, look at it several times a day to make sure  it is healing.  Do not use heating pads or hot water bottles. They may burn your skin. If you have lost feeling in your feet or legs, you may not know it is happening until it is too late.  Make sure your health care provider performs a complete foot exam at least annually or more often if you have foot problems. Report any cuts, sores, or bruises to your health care provider immediately. Contact a health care provider if:  You have an injury that is not healing.  You have cuts or breaks in the skin.  You have an ingrown nail.  You notice redness on your legs or feet.  You feel burning or tingling in your legs or feet.  You have pain or cramps in your legs and feet.  Your legs or feet are numb.  Your feet always feel cold. Get help right away if:  There is increasing redness, swelling, or pain in or around a wound.  There is a red line that goes up your leg.  Pus is coming from a wound.  You develop a fever or as directed by your health care provider.  You notice a bad smell coming from an ulcer or wound. This information is not intended to replace advice given to you by your health care provider. Make sure you discuss any questions you have with your health care provider. Document Released: 03/26/2000 Document Revised: 09/04/2015 Document Reviewed: 09/05/2012 Elsevier Interactive Patient Education  2017 Elsevier Inc.  

## 2018-02-01 DIAGNOSIS — F250 Schizoaffective disorder, bipolar type: Secondary | ICD-10-CM | POA: Diagnosis not present

## 2018-02-10 DIAGNOSIS — F250 Schizoaffective disorder, bipolar type: Secondary | ICD-10-CM | POA: Diagnosis not present

## 2018-02-12 ENCOUNTER — Encounter: Payer: Self-pay | Admitting: Podiatry

## 2018-02-12 NOTE — Progress Notes (Signed)
Subjective: Traci Pena is a 47 yo hearing impaired AAF who presents with sign interpreter Sunny Schlein) on her visit today. She relates tenderness to toenails b/l feet due to thickness and pain when wearing enclosed shoe gear. Condition is longstanding. She also complains of itching between toes of both feet for greater than 2 weeks. She denies any redness, blisters, drainage or swelling.   She has been diabetic for the past 4-5 years. She relates her hearing impairment is the result of a childhood infection which caused her eardrums to rupture.   She is a very heavy smoker.  Medical History   Date Unknown Chronic constipation  Date Unknown Depression  Date Unknown Diabetes mellitus without complication (HCC)  Date Unknown Gastritis  Date Unknown GERD (gastroesophageal reflux disease)  Date Unknown Hypertension   Surgical History   09/06/2014 Colonoscopy (N/A)    Medications    albuterol (PROVENTIL HFA;VENTOLIN HFA) 108 (90 BASE) MCG/ACT inhaler    aspirin EC 81 MG tablet    atenolol (TENORMIN) 25 MG tablet    benztropine (COGENTIN) 1 MG tablet    budesonide-formoterol (SYMBICORT) 160-4.5 MCG/ACT inhaler    cetirizine (ZYRTEC) 10 MG tablet    clonazePAM (KLONOPIN) 0.5 MG tablet    cloZAPine (CLOZARIL) 100 MG tablet    Exenatide ER (BYDUREON) 2 MG PEN    fluocinonide cream (LIDEX) 0.05 %    fluticasone (FLONASE) 50 MCG/ACT nasal spray    insulin aspart (NOVOLOG FLEXPEN) 100 UNIT/ML FlexPen    Insulin Glargine (TOUJEO SOLOSTAR) 300 UNIT/ML SOPN    insulin lispro (HUMALOG) 100 UNIT/ML KiwkPen    Insulin Pen Needle (FIFTY50 PEN NEEDLES) 31G X 8 MM MISC    iron polysaccharides (NIFEREX) 150 MG capsule    lisinopril (PRINIVIL,ZESTRIL) 10 MG tablet    lithium 600 MG capsule    medroxyPROGESTERone (DEPO-PROVERA) 150 MG/ML injection    metFORMIN (GLUCOPHAGE) 1000 MG tablet    montelukast (SINGULAIR) 10 MG tablet    Multiple Vitamin (MULTIVITAMIN) tablet    omeprazole (PRILOSEC) 40 MG  capsule    polyethylene glycol powder (GLYCOLAX/MIRALAX) powder    sertraline (ZOLOFT) 50 MG tablet    simvastatin (ZOCOR) 20 MG tablet    tiotropium (SPIRIVA) 18 MCG inhalation capsule    triamcinolone cream (KENALOG) 0.5 %    Allergies      No Known Allergies   Tobacco History   Smoking Status  Current Every Day Smoker  Types  Cigarettes  Amount 4 packs/day for 3 years (12.00 pk-yrs)      Smokeless Tobacco Status  Never Used   Family History   None   Review  of  Systems  Pertinent positives highlighted in color Pertinent negatives remain in black              General:  Usual weight, recent weight change, weakness, fatigue, fever, night sweats, anorexia, malaise                    Skin:  Color changes, pruritus, bruising, petechiae, infections, rashes, sores, changes in moles, changes in hair or nails                    Head:  Headache, head injury   Eyes:  Vision, glasses/contact lens, date of last eye examination, pain, redness, excessive tearing, double vision (diplopia),  floaters (spots in front of eyes), loss of any visual fields, history of glaucoma or cataracts  Ears:  Hearing loss (total deafness; uses sign-language as primary source of communication), change in hearing, ringing in ears (tinnitus), ear infections   Nose and Sinuses:  Frequent colds, nasal stuffiness, hay fever, nosebleeds (epistaxis), sinus trouble, obstruction, discharge,   pain, change in ability to smell, sneezing, post-nasal drip, history of nasal polyps   Mouth and throat:  Soreness, dryness, pain, ulcers, sore tongue, bleeding gums, pyorrhea, teeth (caries, abscesses,    extractions, dentures), sore throat, hoarseness, history of recurrent sore throats or of  strep throat or of        rheumatic fever                    Neck:  Lumps, swollen lymph nodes or glands, goiter (thyroid enlargement), pain                  Lymphatics:  Swollen lymph nodes in neck, axillae, epitrochlear  areas, or inguinal area                    Breasts:  Recent [employer] study (dx: cysts) for abnormal mammography b/l, Lumps, pain, nipple discharge, self-examination, enlargement in men or children (gynecomastia)                    Pulmonary:  Cough (duration, association with sputum production), change in chronic cough, trouble breathing (dyspnea), wheezing, coughing up blood (hemoptysis), pain with taking a deep breath (pleuritic chest pain), blue discoloration of lips or nailbeds (cyanosis), history of exposure to TB, history of a previous TB skin test and the results if done, recurrent pneumonia, history of environmental exposure                    Cardiovascular:  Chest pain,  orthopnea  edema, palpitations, hypertension, known heart disease, history of a murmur, history of rheumatic fever, syncope or near syncope, pain in posterior calves with walking (claudication), varicosities, thrombophlebitis,   Gastrointestinal:  Trouble swallowing (dysphagia), pain with swallowing (odynophagia), nausea, vomiting, vomitin   blood   (hematemesis),     food   intolerance,     indigestion, heartburn, change in appetite, sensation of filling up  earlier than usual (early satiety),frequency and character (formed vs. loose) of bowel movements, changes in bowel pattern, rectal bleeding, passing black tarry stools (melena), constipation, diarrhea, abdominal pain, excessive belching or passing of gas, hemorrhoids, jaundice, liver or gallbladder problems,   history  of   hepatitis                          Urinary:  Blood in urine (hematuria), pain on urination (dysuria), frequency, suprapubic pain, costovertebral angle (CVA)  tenderness, frequent urination at night (nocturia), passing large volumes of urine on a frequent basis (polyuria),  stones, inguinal pain, trouble initiating urinary stream, incontinence, history of urinary tract infections                              Musculoskeletal:  Joint pains or stiffness,  arthritis, gout, backache, joint swelling or tenderness or effusion, limitation of  motion, history of fractures  Neurologic:  Fainting, blackouts, seizures, paralysis, local weakness, numbness, tingling, tremors, memory changes,   headaches,    vertigo  or     dizziness,    muscle   atrophy  Psychiatric:  Anxiety, nightmares, nervousness, irritability, depression, insomnia, hypersomnia, phobias, tension.  If there are any clues whatsoever that the patient may be suicidal, may have criminal or other sociopathic behavior, this should be   pursued.                          Endocrine:  Thyroid trouble, heat or cold intolerance, excessive sweating or flushing, diabetes, excessive thirst or hunger   or urination                    Hematologic:  Anemia, easy bruising or bleeding, past transfusions and reactions  Objective: Vascular Examination: Capillary refill time <3 seconds x 10 digits Dorsalis pedis present b/l Posterior tibial pulses palpable b/l No digital hair x 10 digits Skin temperature warm to warm b/l  Dermatological Examination: Skin with normal turgor, texture and tone b/l Toenails 1-5 b/l discolored, thick, dystrophic with subungual debris and pain with palpation to nailbeds due to thickness of nails. Diffuse scaling noted b/l plantarly and peripherally with mild foot odor Mild callosity noted plantarmedial heelpad b/l  Musculoskeletal: Muscle strength 5/5 to all LE muscle groups  Neurological: Sensation intact with 10 gram monofilament. Vibratory sensation diminished  Assessment: 1. Painful onychomycosis toenails 1-5 b/l  2. Tinea pedis b/l 3. NIDDM with early neuropathy 4. Callus b/l heel  Plan: 1. Discussed diagnoses and treatment options today. 2. Toenails 1-5 b/l were debrided in length and girth without iatrogenic bleeding. 3. Calluses debrided b/l heels.  4. Rx for Clotrimazole Cream 1% to be applied to both feet and between toes bid x 4  weeks. 5. Printed information dispensed to patient today on Athlete's Feet and Diabetic Foot Care. 6. Patient to continue soft, supportive shoe gear 7. Patient to report any pedal injuries to medical professional immediately. 8. Follow up 3 months. Patient/POA to call should there be a concern in the interim.

## 2018-02-15 DIAGNOSIS — F250 Schizoaffective disorder, bipolar type: Secondary | ICD-10-CM | POA: Diagnosis not present

## 2018-03-06 DIAGNOSIS — F250 Schizoaffective disorder, bipolar type: Secondary | ICD-10-CM | POA: Diagnosis not present

## 2018-03-15 DIAGNOSIS — F250 Schizoaffective disorder, bipolar type: Secondary | ICD-10-CM | POA: Diagnosis not present

## 2018-03-22 DIAGNOSIS — E119 Type 2 diabetes mellitus without complications: Secondary | ICD-10-CM | POA: Diagnosis not present

## 2018-03-22 DIAGNOSIS — Z794 Long term (current) use of insulin: Secondary | ICD-10-CM | POA: Diagnosis not present

## 2018-03-31 DIAGNOSIS — Z794 Long term (current) use of insulin: Secondary | ICD-10-CM | POA: Diagnosis not present

## 2018-03-31 DIAGNOSIS — F17200 Nicotine dependence, unspecified, uncomplicated: Secondary | ICD-10-CM | POA: Diagnosis not present

## 2018-03-31 DIAGNOSIS — E119 Type 2 diabetes mellitus without complications: Secondary | ICD-10-CM | POA: Diagnosis not present

## 2018-03-31 DIAGNOSIS — E1165 Type 2 diabetes mellitus with hyperglycemia: Secondary | ICD-10-CM | POA: Diagnosis not present

## 2018-04-15 IMAGING — US US BREAST*R* LIMITED INC AXILLA
1 series · 10 of 10 positions shown · non-contrast
Comparison: Previous exam(s).

CLINICAL DATA: Patient recalled from screening for bilateral breast
masses.

EXAM:
DIGITAL DIAGNOSTIC BILATERAL MAMMOGRAM WITH CAD
ULTRASOUND BILATERAL BREAST

[Series 1: us breast*right* limited inc axilla · 0.06mm/px · 10 of 10 slices shown]
[im 1/10]
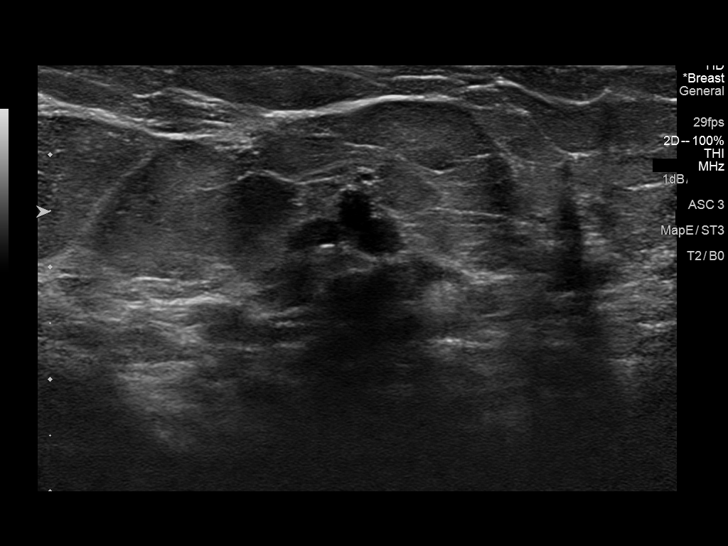
[im 2/10]
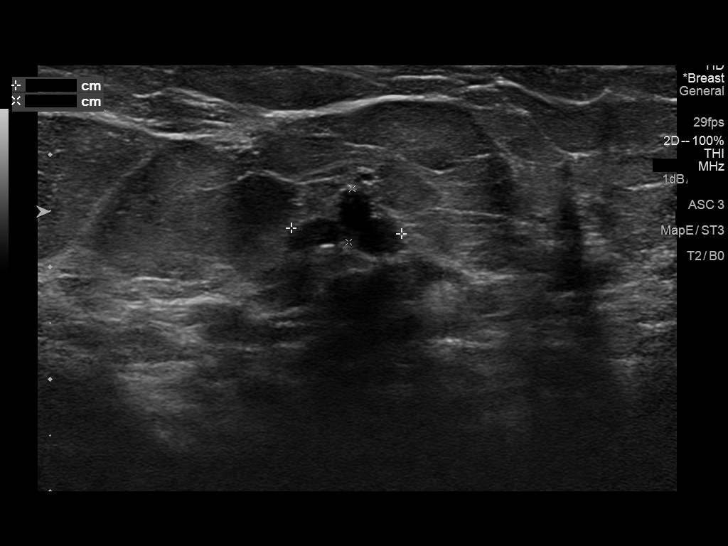
[im 3/10]
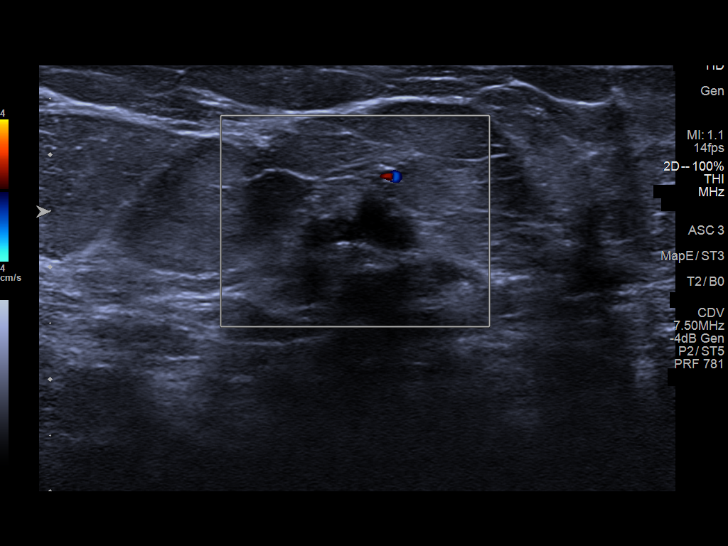
[im 4/10]
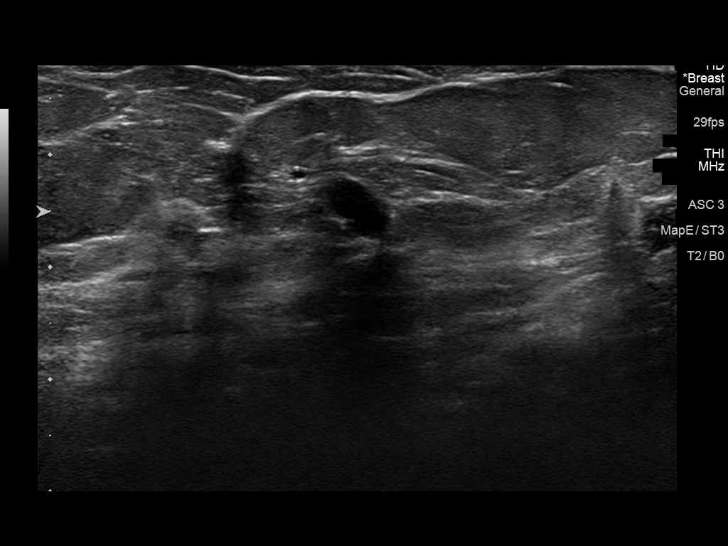
[im 5/10]
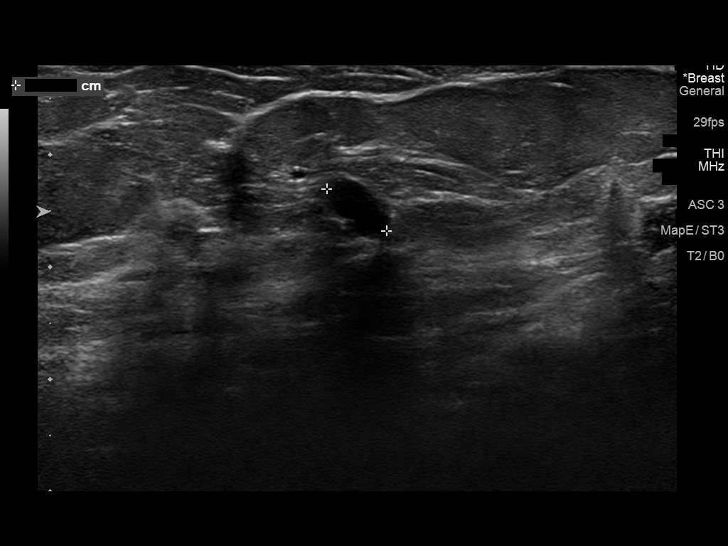
[im 6/10]
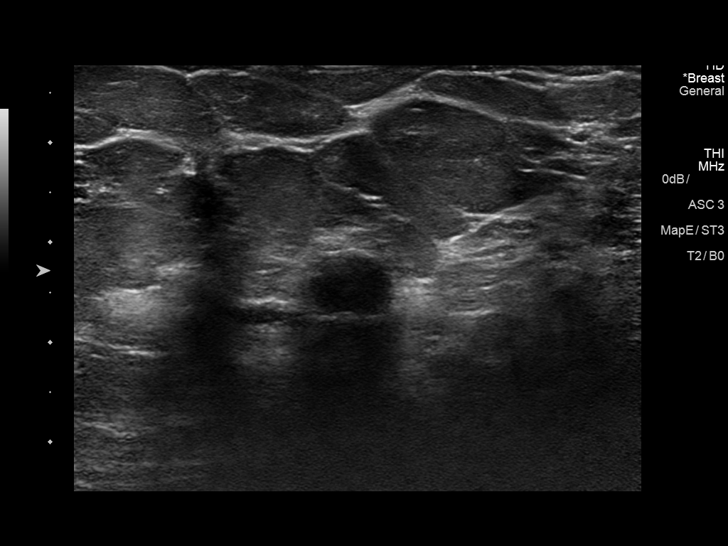
[im 7/10]
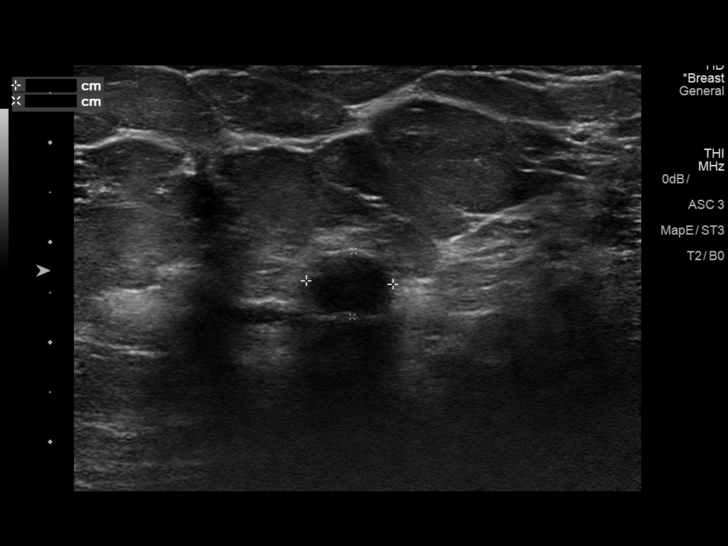
[im 8/10]
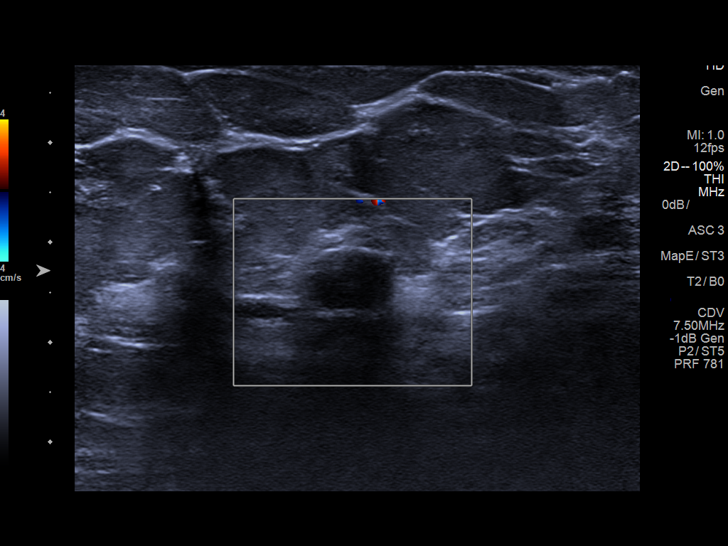
[im 9/10]
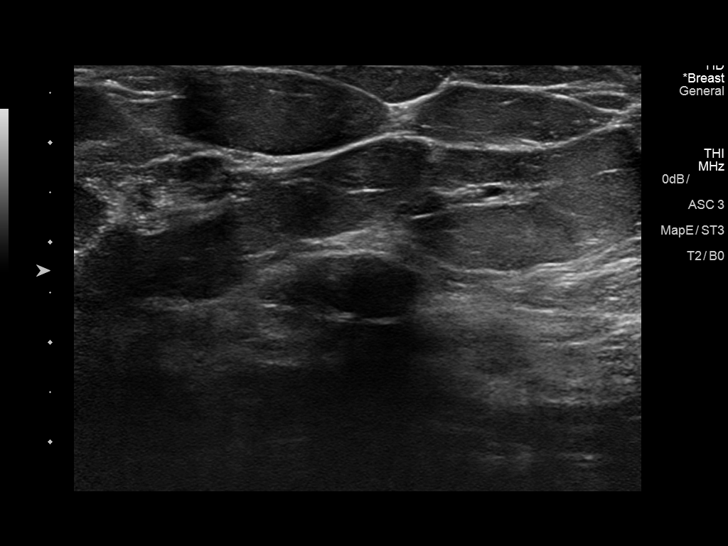
[im 10/10]
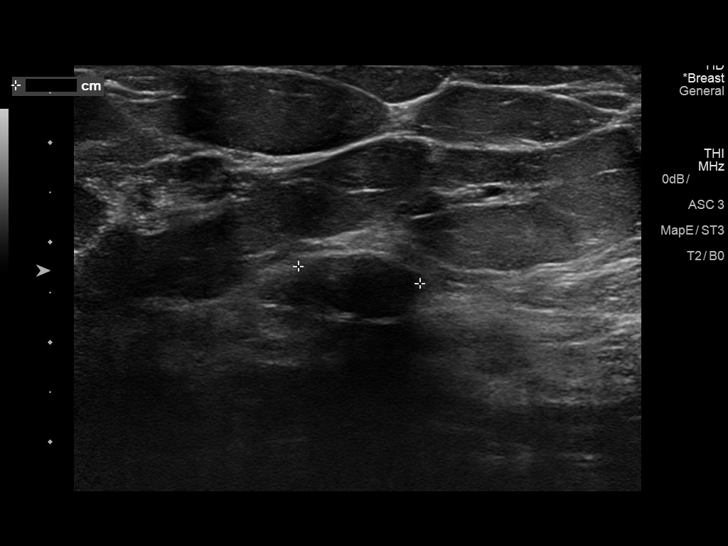

[10 of 10 positions shown; findings below may reference images not displayed]

ACR Breast Density Category c: The breast tissue is heterogeneously
dense, which may obscure small masses.
FINDINGS: Persistent low-density mass within the upper-outer right breast on
additional imaging. Persistent oval circumscribed mass within the
lateral left breast on additional imaging.

Mammographic images were processed with CAD.

Targeted ultrasound is performed, showing a 9 x 5 x 9 mm oval
circumscribed near anechoic mass left breast 3 o'clock position 4 cm
from the nipple, favored to represent a complicated cyst or
intramammary lymph node.

Within the right breast 9 o'clock position 5 cm from the nipple
there is a 10 x 5 x 7 mm hypoechoic mass, favored to represent a
complicated cyst. Deep to this mass within the right breast 6
o'clock position is a 12 x 9 x 6 mm oval circumscribed hypoechoic
mass, favored to represent a probably benign mass such as
fibroadenoma.
IMPRESSION: Probably benign left breast mass favored to represent a complicated
cyst.

Probably benign right breast mass 9 o'clock position 5 cm from
nipple favored represent a complicated cyst.

Probably benign right breast mass 9 o'clock position 6 cm from the
nipple favored represent a fibroadenoma.

RECOMMENDATION:
Bilateral diagnostic mammography and ultrasound in 6 months to
demonstrate stability of probably benign bilateral breast masses.

I have discussed the findings and recommendations with the patient.
Results were also provided in writing at the conclusion of the
visit. If applicable, a reminder letter will be sent to the patient
regarding the next appointment.

BI-RADS CATEGORY  3: Probably benign.

## 2018-04-17 DIAGNOSIS — F250 Schizoaffective disorder, bipolar type: Secondary | ICD-10-CM | POA: Diagnosis not present

## 2018-05-01 IMAGING — MG MM DIGITAL DIAGNOSTIC BILAT W/ TOMO W/ CAD
8 of 12 series · 8 of 28 positions shown · non-contrast
Comparison: Previous exam(s).

CLINICAL DATA: 45-year-old female for follow-up of bilateral breast
masses.

EXAM:
2D DIGITAL DIAGNOSTIC BILATERAL MAMMOGRAM WITH CAD AND ADJUNCT TOMO
ULTRASOUND BILATERAL BREAST

[L CC]
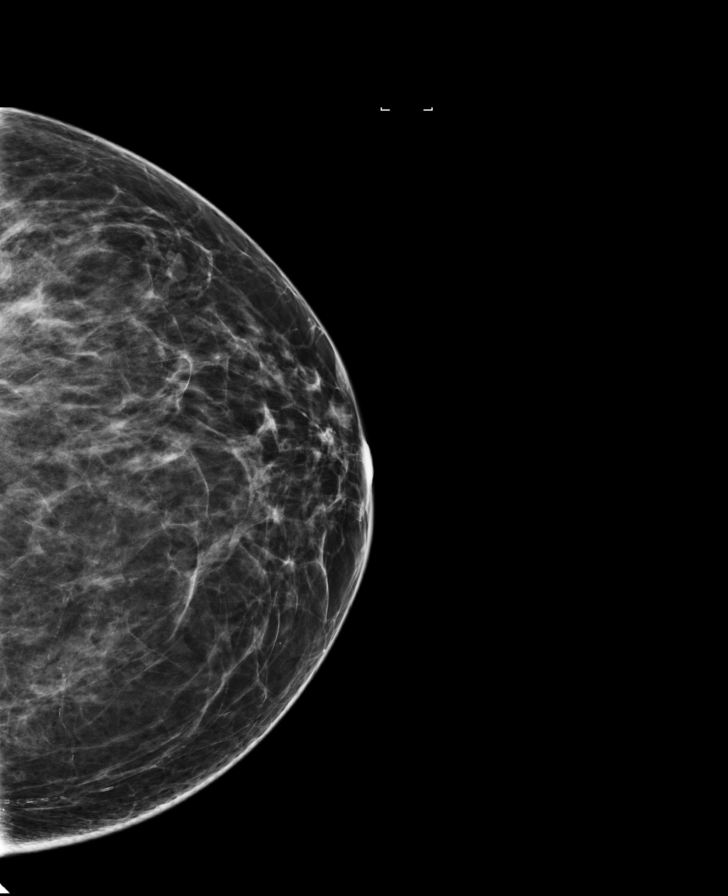

[R MLO synth-2D]
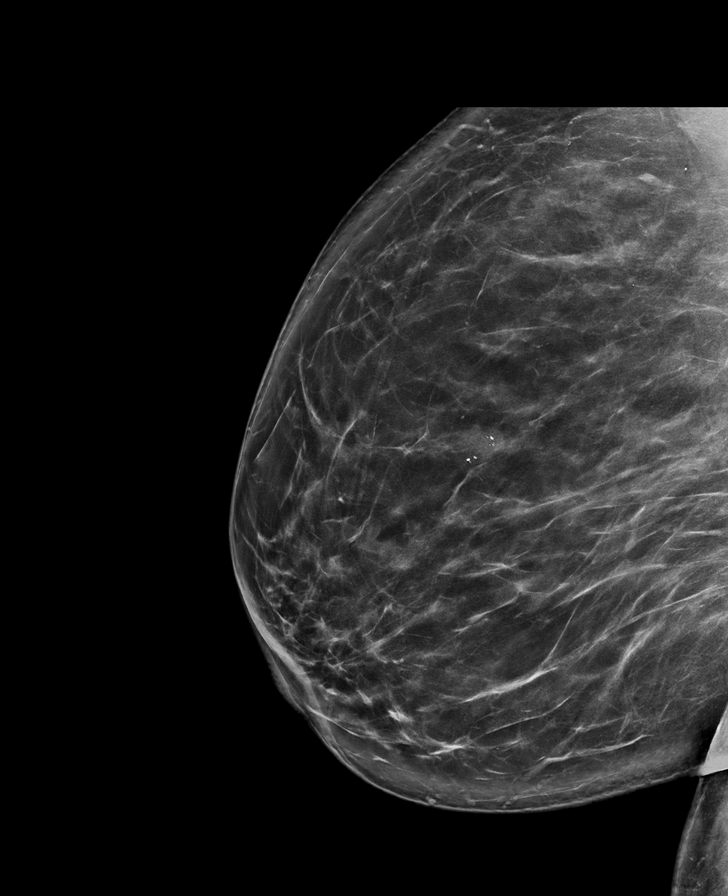

[L MLO]
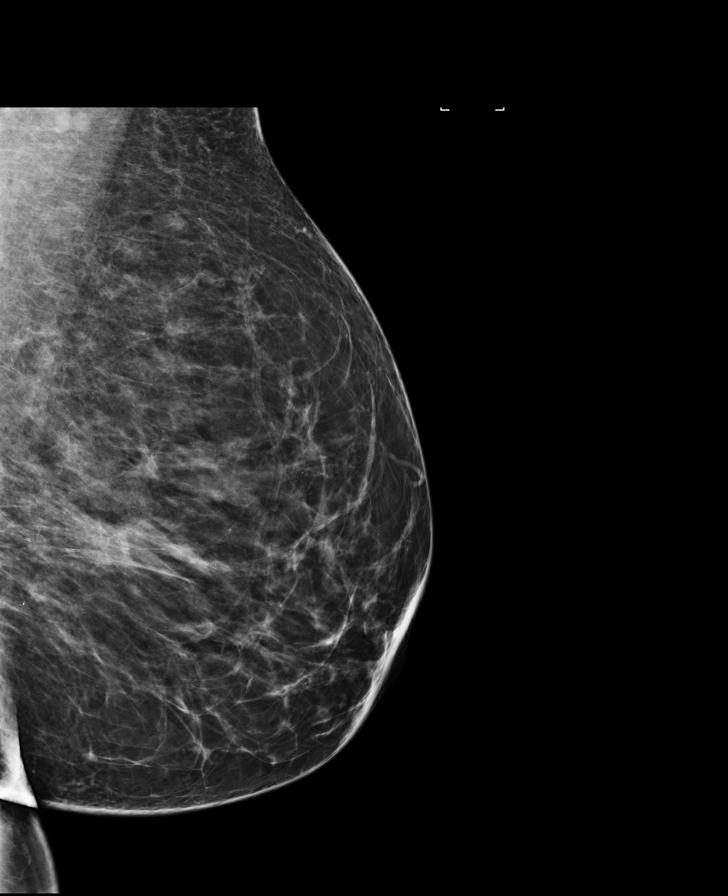

[R CC]
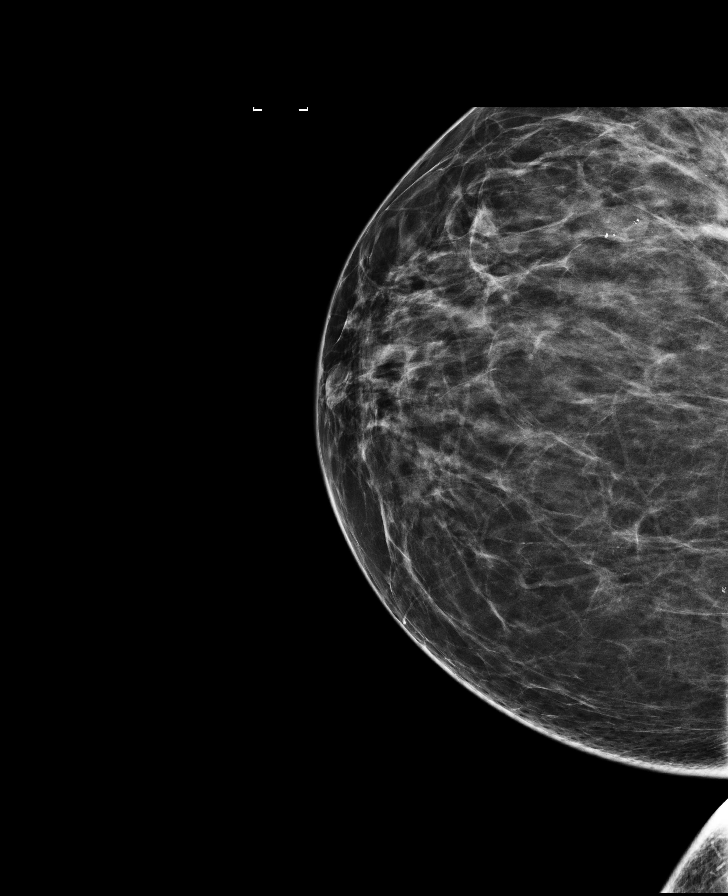

[L CC synth-2D]
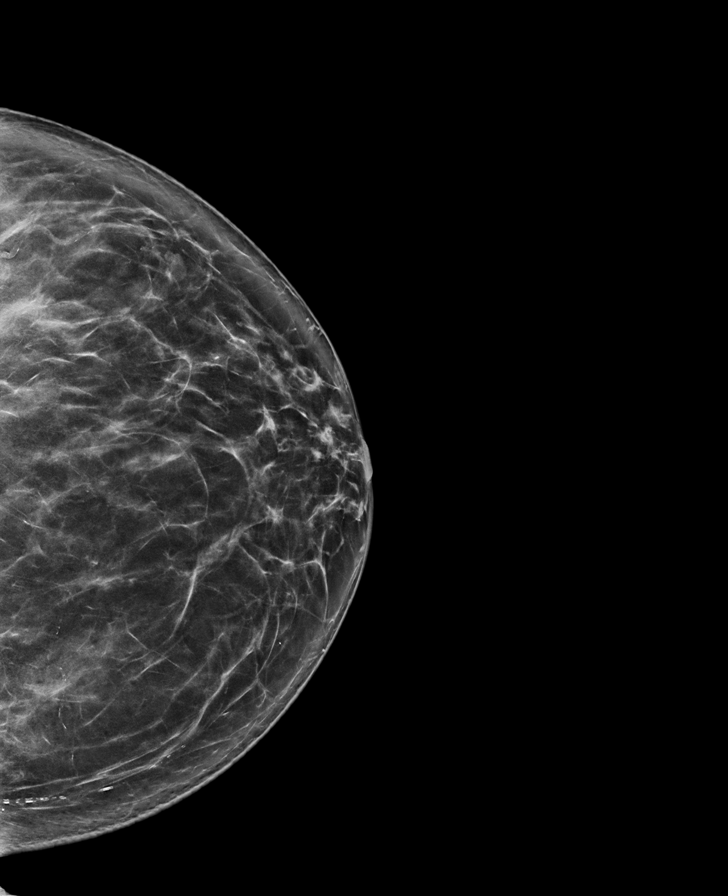

[R MLO]
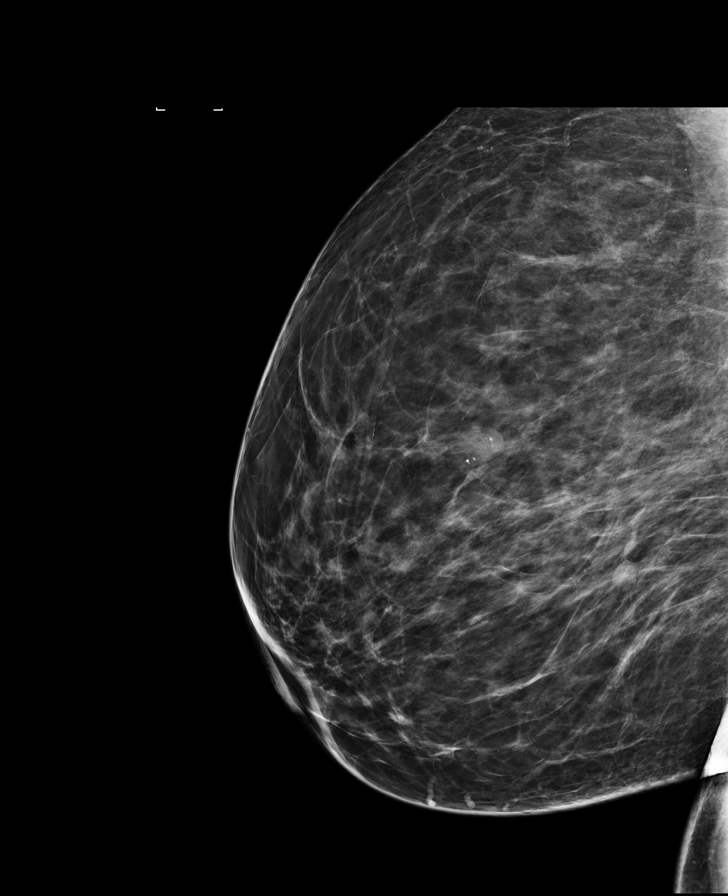

[R CC synth-2D]
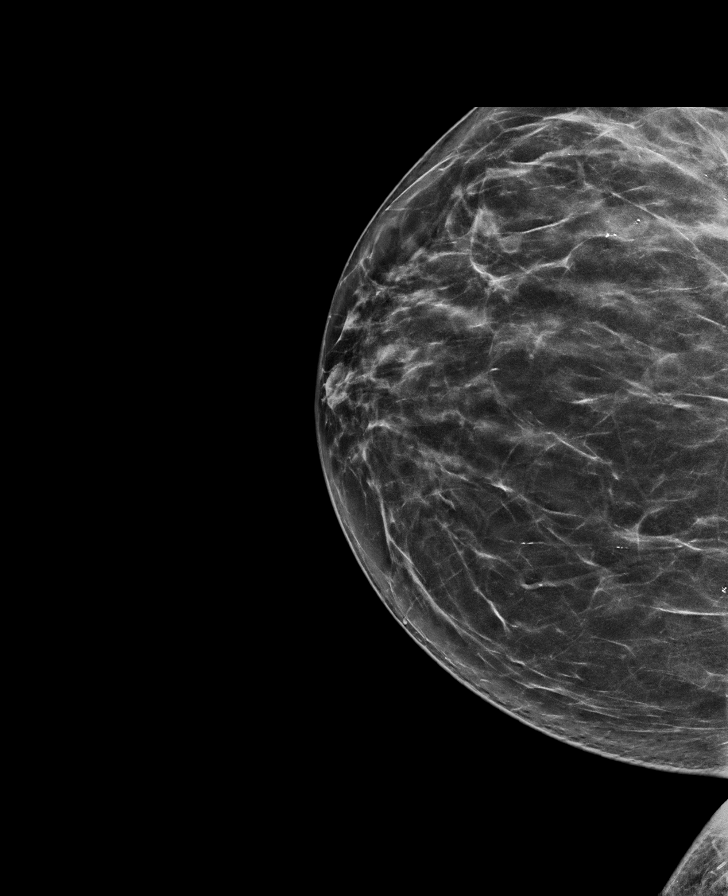

[L MLO synth-2D]
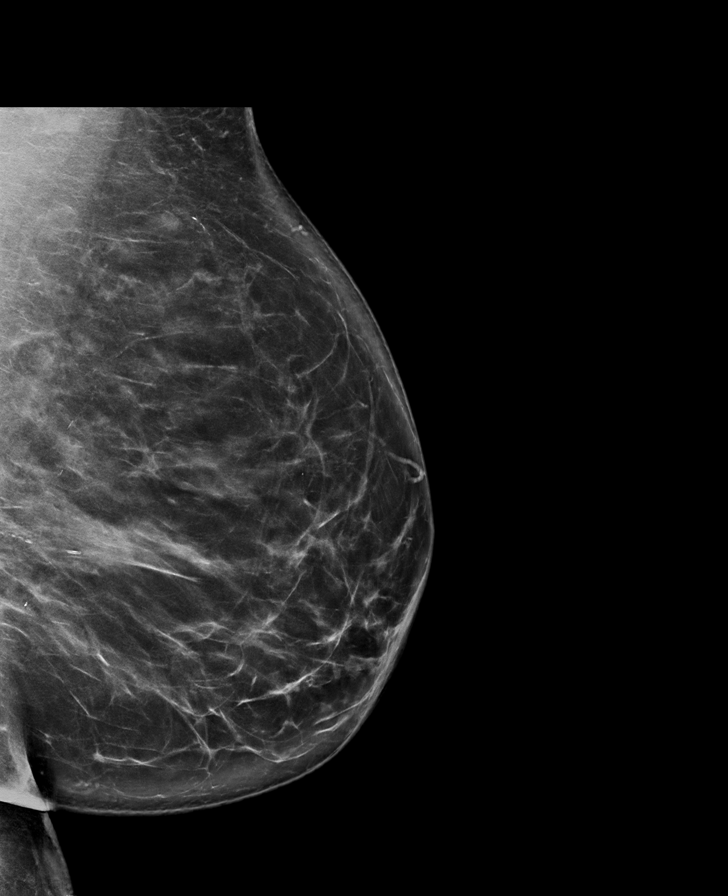

[8 of 28 positions shown; findings below may reference images not displayed]

ACR Breast Density Category b: There are scattered areas of
fibroglandular density.
FINDINGS: 2D and 3D full field views of both breasts demonstrate a stable
circumscribed oval mass within the upper-outer right breast and
outer left breast.

No new mass, distortion or worrisome calcifications are noted.

Mammographic images were processed with CAD.

Targeted ultrasound is performed, showing stable circumscribed oval
hypoechoic masses as follows:

A 1.6 x 0.8 x 1 cm mass at the 9 o'clock position of the right
breast 6 cm from the nipple

A 0.9 x 0.5 x 0.9 cm mass at the 9 o'clock position of the right
breast 5 cm from the nipple

A 0.8 x 0.4 x 0.7 cm mass at the 3 o'clock position of the left
breast 4 cm from the nipple

No new solid or cystic masses are identified within the breasts.
IMPRESSION: Stable likely benign masses/ complicated cysts within the breasts as
described. Six-month followup is recommended to ensure 1 year
stability.

RECOMMENDATION:
Bilateral diagnostic mammograms with bilateral breast ultrasound in
6 months.

I have discussed the findings and recommendations with the patient.
Results were also provided in writing at the conclusion of the
visit. If applicable, a reminder letter will be sent to the patient
regarding the next appointment.

BI-RADS CATEGORY  3: Probably benign.

## 2018-05-02 ENCOUNTER — Ambulatory Visit: Payer: Medicare Other | Admitting: Podiatry

## 2018-05-15 DIAGNOSIS — F250 Schizoaffective disorder, bipolar type: Secondary | ICD-10-CM | POA: Diagnosis not present

## 2018-05-16 DIAGNOSIS — F250 Schizoaffective disorder, bipolar type: Secondary | ICD-10-CM | POA: Diagnosis not present

## 2018-05-22 DIAGNOSIS — F250 Schizoaffective disorder, bipolar type: Secondary | ICD-10-CM | POA: Diagnosis not present

## 2018-06-07 DIAGNOSIS — Z23 Encounter for immunization: Secondary | ICD-10-CM | POA: Diagnosis not present

## 2018-06-12 DIAGNOSIS — F250 Schizoaffective disorder, bipolar type: Secondary | ICD-10-CM | POA: Diagnosis not present

## 2018-06-19 DIAGNOSIS — M79675 Pain in left toe(s): Secondary | ICD-10-CM | POA: Diagnosis not present

## 2018-06-19 DIAGNOSIS — B351 Tinea unguium: Secondary | ICD-10-CM | POA: Diagnosis not present

## 2018-06-19 DIAGNOSIS — M2041 Other hammer toe(s) (acquired), right foot: Secondary | ICD-10-CM | POA: Diagnosis not present

## 2018-06-19 DIAGNOSIS — L84 Corns and callosities: Secondary | ICD-10-CM | POA: Diagnosis not present

## 2018-06-19 DIAGNOSIS — E119 Type 2 diabetes mellitus without complications: Secondary | ICD-10-CM | POA: Diagnosis not present

## 2018-07-04 DIAGNOSIS — F17200 Nicotine dependence, unspecified, uncomplicated: Secondary | ICD-10-CM | POA: Diagnosis not present

## 2018-07-04 DIAGNOSIS — Z9114 Patient's other noncompliance with medication regimen: Secondary | ICD-10-CM | POA: Diagnosis not present

## 2018-07-04 DIAGNOSIS — Z794 Long term (current) use of insulin: Secondary | ICD-10-CM | POA: Diagnosis not present

## 2018-07-04 DIAGNOSIS — E66812 Obesity, class 2: Secondary | ICD-10-CM | POA: Insufficient documentation

## 2018-07-04 DIAGNOSIS — E669 Obesity, unspecified: Secondary | ICD-10-CM | POA: Insufficient documentation

## 2018-07-04 DIAGNOSIS — E1169 Type 2 diabetes mellitus with other specified complication: Secondary | ICD-10-CM | POA: Diagnosis not present

## 2018-07-04 DIAGNOSIS — Z6839 Body mass index (BMI) 39.0-39.9, adult: Secondary | ICD-10-CM | POA: Diagnosis not present

## 2018-07-04 DIAGNOSIS — E1165 Type 2 diabetes mellitus with hyperglycemia: Secondary | ICD-10-CM | POA: Diagnosis not present

## 2018-07-07 DIAGNOSIS — D509 Iron deficiency anemia, unspecified: Secondary | ICD-10-CM | POA: Diagnosis not present

## 2018-07-07 DIAGNOSIS — E1169 Type 2 diabetes mellitus with other specified complication: Secondary | ICD-10-CM | POA: Diagnosis not present

## 2018-07-07 DIAGNOSIS — I10 Essential (primary) hypertension: Secondary | ICD-10-CM | POA: Diagnosis not present

## 2018-07-07 DIAGNOSIS — E559 Vitamin D deficiency, unspecified: Secondary | ICD-10-CM | POA: Diagnosis not present

## 2018-07-07 DIAGNOSIS — E785 Hyperlipidemia, unspecified: Secondary | ICD-10-CM | POA: Diagnosis not present

## 2018-07-12 DIAGNOSIS — F250 Schizoaffective disorder, bipolar type: Secondary | ICD-10-CM | POA: Diagnosis not present

## 2018-07-31 DIAGNOSIS — F250 Schizoaffective disorder, bipolar type: Secondary | ICD-10-CM | POA: Diagnosis not present

## 2018-08-11 ENCOUNTER — Other Ambulatory Visit: Payer: Medicare Other

## 2018-08-14 ENCOUNTER — Ambulatory Visit: Payer: Medicare Other

## 2018-08-14 ENCOUNTER — Other Ambulatory Visit: Payer: Medicare Other

## 2018-08-14 DIAGNOSIS — F250 Schizoaffective disorder, bipolar type: Secondary | ICD-10-CM | POA: Diagnosis not present

## 2018-08-21 ENCOUNTER — Other Ambulatory Visit: Payer: Medicare Other

## 2018-08-21 ENCOUNTER — Ambulatory Visit: Payer: Medicare Other | Attending: Nurse Practitioner

## 2018-08-21 ENCOUNTER — Inpatient Hospital Stay: Admission: RE | Admit: 2018-08-21 | Payer: Medicare Other | Source: Ambulatory Visit

## 2018-09-07 DIAGNOSIS — F250 Schizoaffective disorder, bipolar type: Secondary | ICD-10-CM | POA: Diagnosis not present

## 2018-09-12 DIAGNOSIS — F250 Schizoaffective disorder, bipolar type: Secondary | ICD-10-CM | POA: Diagnosis not present

## 2018-09-27 ENCOUNTER — Other Ambulatory Visit: Payer: Self-pay | Admitting: Nurse Practitioner

## 2018-09-27 DIAGNOSIS — Z0001 Encounter for general adult medical examination with abnormal findings: Secondary | ICD-10-CM | POA: Diagnosis not present

## 2018-09-27 DIAGNOSIS — N6001 Solitary cyst of right breast: Secondary | ICD-10-CM

## 2018-09-27 DIAGNOSIS — E785 Hyperlipidemia, unspecified: Secondary | ICD-10-CM | POA: Diagnosis not present

## 2018-09-27 DIAGNOSIS — N6002 Solitary cyst of left breast: Secondary | ICD-10-CM

## 2018-09-27 DIAGNOSIS — F209 Schizophrenia, unspecified: Secondary | ICD-10-CM | POA: Diagnosis not present

## 2018-09-27 DIAGNOSIS — I10 Essential (primary) hypertension: Secondary | ICD-10-CM | POA: Diagnosis not present

## 2018-09-27 DIAGNOSIS — E119 Type 2 diabetes mellitus without complications: Secondary | ICD-10-CM | POA: Diagnosis not present

## 2018-09-27 DIAGNOSIS — F17209 Nicotine dependence, unspecified, with unspecified nicotine-induced disorders: Secondary | ICD-10-CM | POA: Diagnosis not present

## 2018-10-03 DIAGNOSIS — E1165 Type 2 diabetes mellitus with hyperglycemia: Secondary | ICD-10-CM | POA: Diagnosis not present

## 2018-10-05 DIAGNOSIS — R0602 Shortness of breath: Secondary | ICD-10-CM | POA: Diagnosis not present

## 2018-10-05 DIAGNOSIS — J449 Chronic obstructive pulmonary disease, unspecified: Secondary | ICD-10-CM | POA: Diagnosis not present

## 2018-10-05 DIAGNOSIS — F17200 Nicotine dependence, unspecified, uncomplicated: Secondary | ICD-10-CM | POA: Diagnosis not present

## 2018-10-10 ENCOUNTER — Ambulatory Visit
Admission: RE | Admit: 2018-10-10 | Discharge: 2018-10-10 | Disposition: A | Discharge status: Home / Self Care | Payer: Medicare Other | Source: Ambulatory Visit | Attending: Nurse Practitioner | Admitting: Nurse Practitioner

## 2018-10-10 ENCOUNTER — Other Ambulatory Visit: Payer: Self-pay

## 2018-10-10 DIAGNOSIS — N6311 Unspecified lump in the right breast, upper outer quadrant: Secondary | ICD-10-CM | POA: Diagnosis not present

## 2018-10-10 DIAGNOSIS — N6002 Solitary cyst of left breast: Secondary | ICD-10-CM | POA: Insufficient documentation

## 2018-10-10 DIAGNOSIS — E1169 Type 2 diabetes mellitus with other specified complication: Secondary | ICD-10-CM | POA: Diagnosis not present

## 2018-10-10 DIAGNOSIS — N6323 Unspecified lump in the left breast, lower outer quadrant: Secondary | ICD-10-CM | POA: Diagnosis not present

## 2018-10-10 DIAGNOSIS — F17200 Nicotine dependence, unspecified, uncomplicated: Secondary | ICD-10-CM | POA: Diagnosis not present

## 2018-10-10 DIAGNOSIS — N6313 Unspecified lump in the right breast, lower outer quadrant: Secondary | ICD-10-CM | POA: Diagnosis not present

## 2018-10-10 DIAGNOSIS — E1165 Type 2 diabetes mellitus with hyperglycemia: Secondary | ICD-10-CM | POA: Diagnosis not present

## 2018-10-10 DIAGNOSIS — N6001 Solitary cyst of right breast: Secondary | ICD-10-CM

## 2018-10-10 DIAGNOSIS — E669 Obesity, unspecified: Secondary | ICD-10-CM | POA: Diagnosis not present

## 2018-10-10 DIAGNOSIS — R921 Mammographic calcification found on diagnostic imaging of breast: Secondary | ICD-10-CM | POA: Diagnosis not present

## 2018-10-10 DIAGNOSIS — Z794 Long term (current) use of insulin: Secondary | ICD-10-CM | POA: Diagnosis not present

## 2018-10-10 DIAGNOSIS — N6321 Unspecified lump in the left breast, upper outer quadrant: Secondary | ICD-10-CM | POA: Diagnosis not present

## 2018-10-17 DIAGNOSIS — F250 Schizoaffective disorder, bipolar type: Secondary | ICD-10-CM | POA: Diagnosis not present

## 2018-10-17 IMAGING — US US BREAST*L* LIMITED INC AXILLA
1 series · 6 of 6 positions shown · non-contrast
Comparison: Previous exam(s).

CLINICAL DATA: 45-year-old female for follow-up of bilateral breast
masses.

EXAM:
2D DIGITAL DIAGNOSTIC BILATERAL MAMMOGRAM WITH CAD AND ADJUNCT TOMO
ULTRASOUND BILATERAL BREAST

[Series 1: us breast*left* limited inc axilla · 0.06mm/px · 6 of 6 slices shown]
[im 1/6]
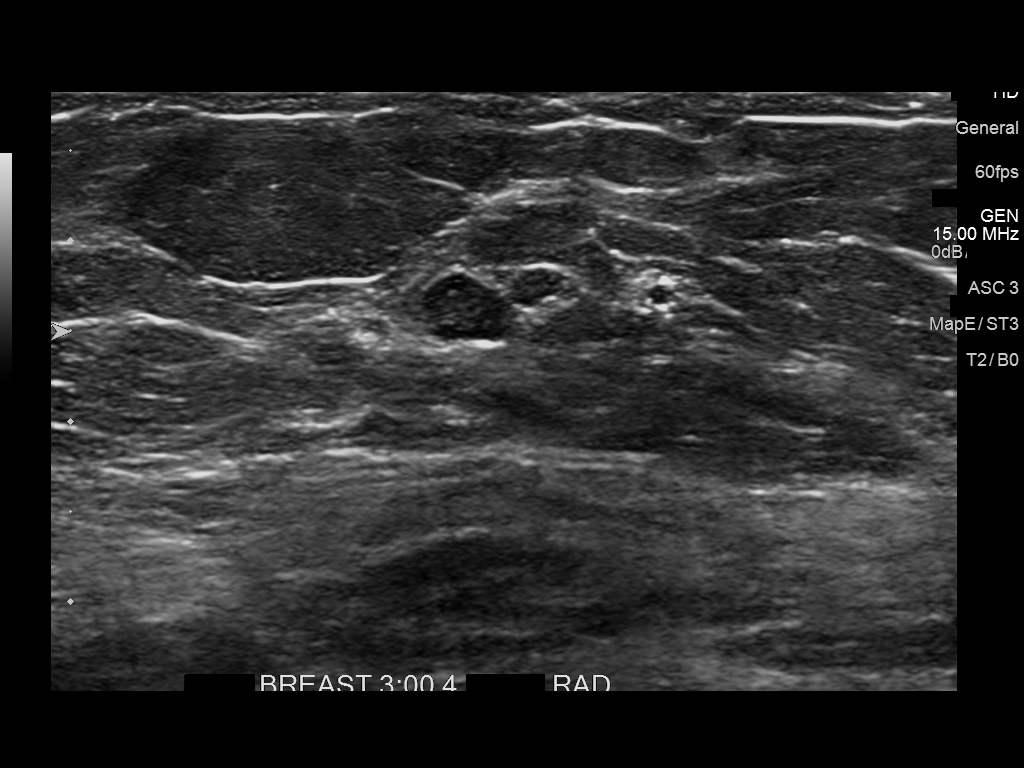
[im 2/6]
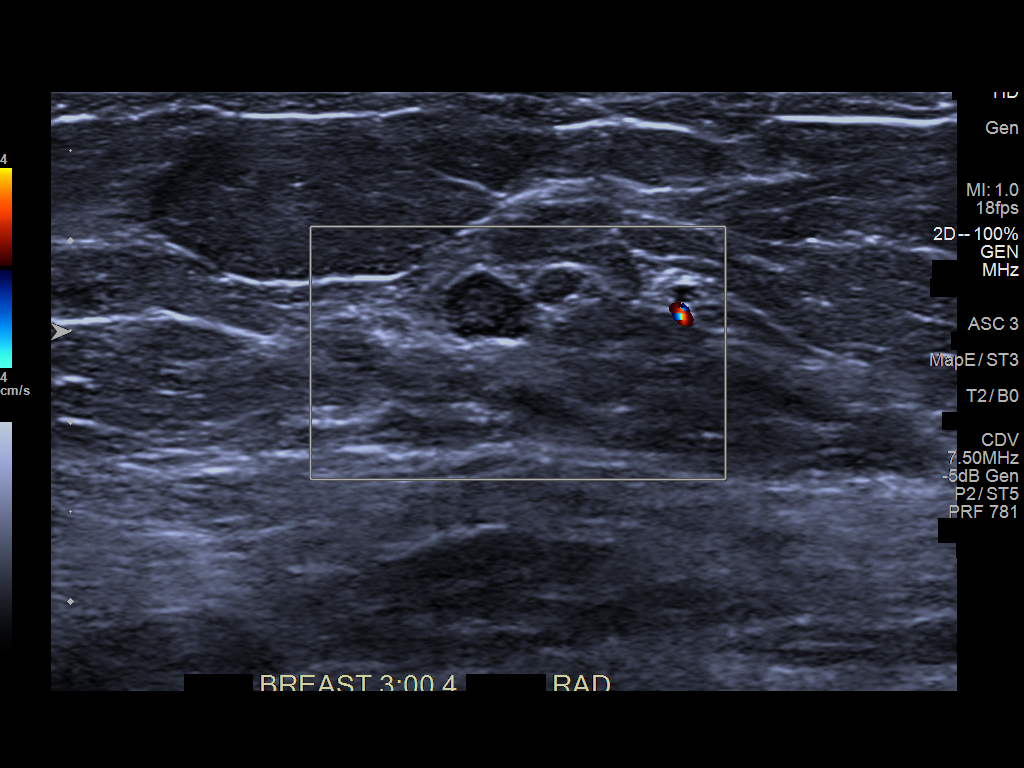
[im 3/6]
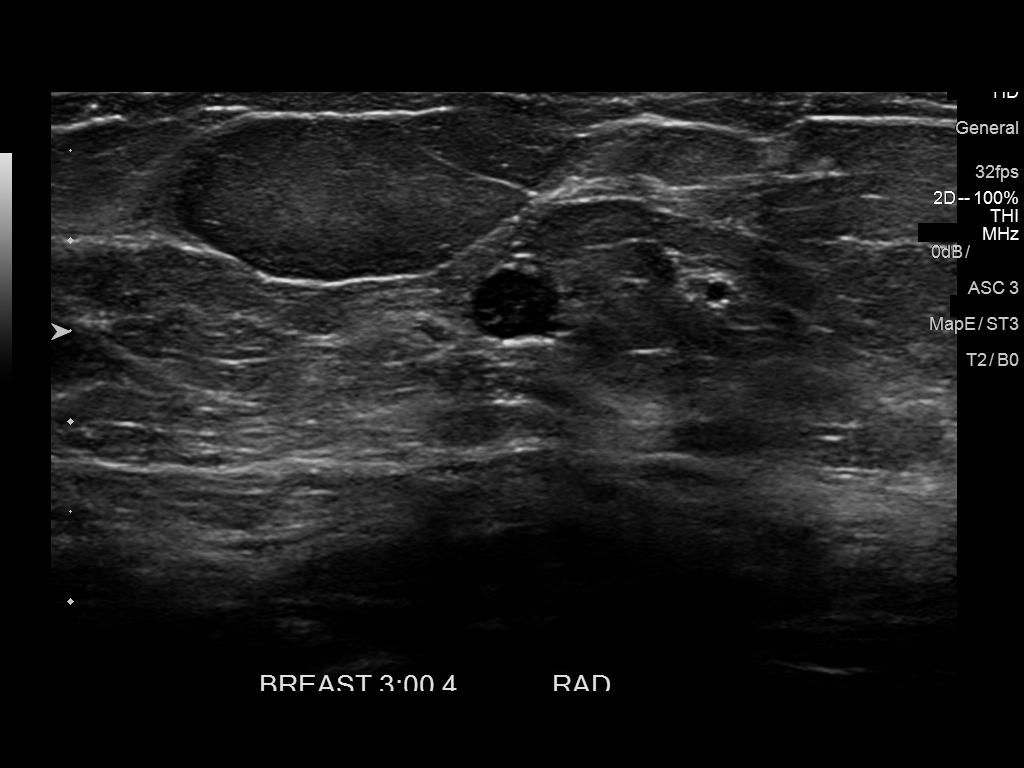
[im 4/6]
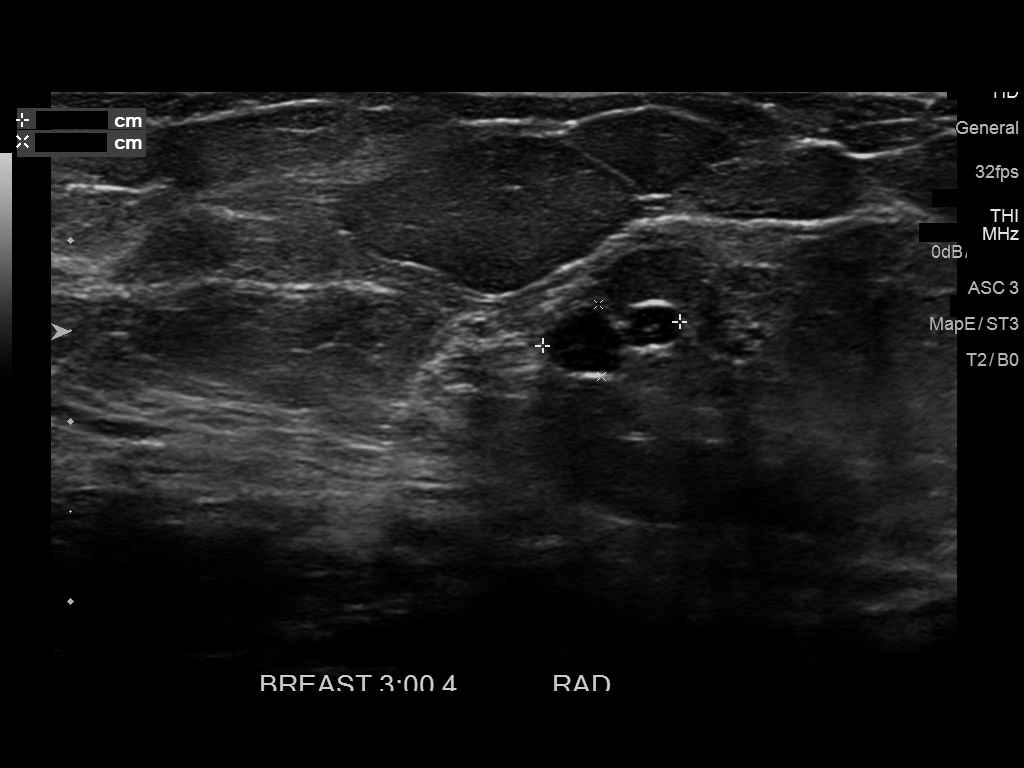
[im 5/6]
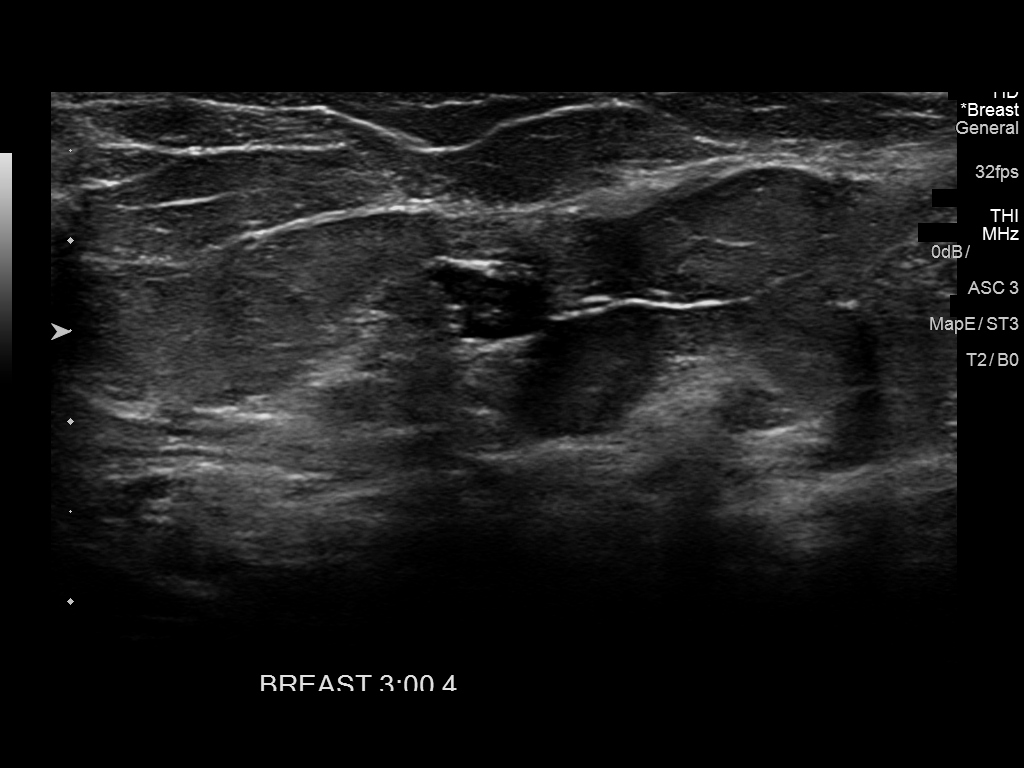
[im 6/6]
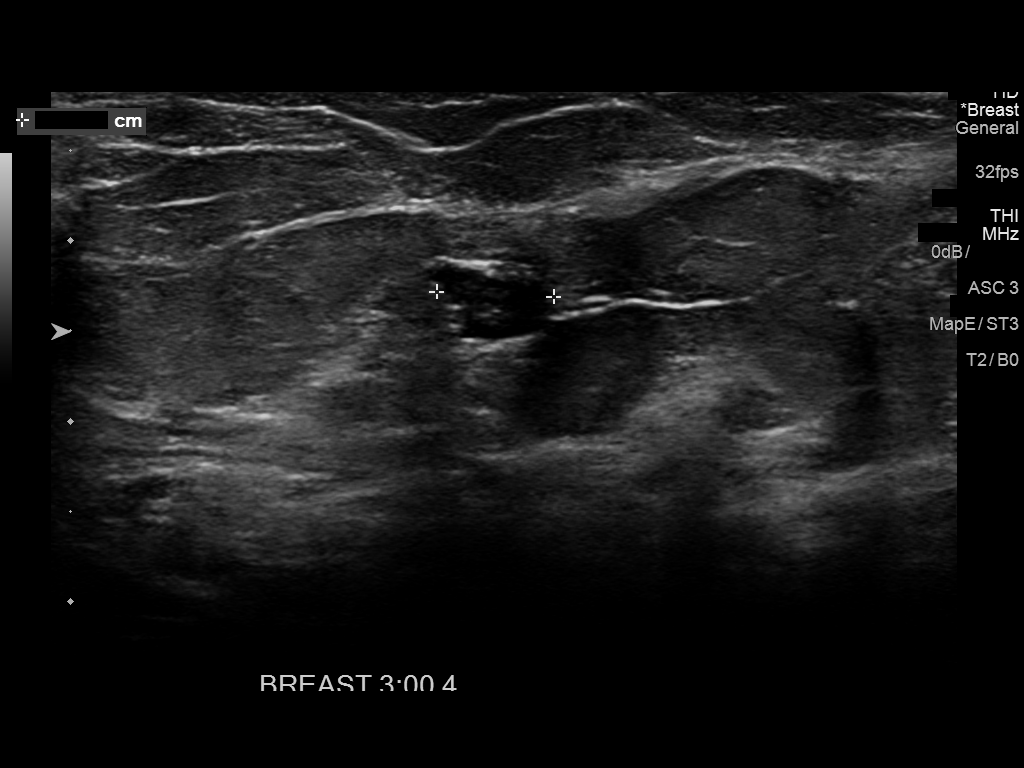

[6 of 6 positions shown; findings below may reference images not displayed]

ACR Breast Density Category b: There are scattered areas of
fibroglandular density.
FINDINGS: 2D and 3D full field views of both breasts demonstrate a stable
circumscribed oval mass within the upper-outer right breast and
outer left breast.

No new mass, distortion or worrisome calcifications are noted.

Mammographic images were processed with CAD.

Targeted ultrasound is performed, showing stable circumscribed oval
hypoechoic masses as follows:

A 1.6 x 0.8 x 1 cm mass at the 9 o'clock position of the right
breast 6 cm from the nipple

A 0.9 x 0.5 x 0.9 cm mass at the 9 o'clock position of the right
breast 5 cm from the nipple

A 0.8 x 0.4 x 0.7 cm mass at the 3 o'clock position of the left
breast 4 cm from the nipple

No new solid or cystic masses are identified within the breasts.
IMPRESSION: Stable likely benign masses/ complicated cysts within the breasts as
described. Six-month followup is recommended to ensure 1 year
stability.

RECOMMENDATION:
Bilateral diagnostic mammograms with bilateral breast ultrasound in
6 months.

I have discussed the findings and recommendations with the patient.
Results were also provided in writing at the conclusion of the
visit. If applicable, a reminder letter will be sent to the patient
regarding the next appointment.

BI-RADS CATEGORY  3: Probably benign.

## 2018-10-31 IMAGING — US US BREAST*R* LIMITED INC AXILLA
1 series · 12 of 18 positions shown · non-contrast
Comparison: Mammography 02/07/2017, 08/06/2016, 07/13/2016 and
earlier.

CLINICAL DATA: One year interval follow-up of likely benign complex
cysts in both breasts.

EXAM:
DIGITAL DIAGNOSTIC BILATERAL MAMMOGRAM WITH CAD AND TOMO
LIMITED ULTRASOUND BILATERAL BREASTS

[Series 1: us breast*right* limited inc axilla · 0.06mm/px · 12 of 18 slices shown]
[im 1/18]
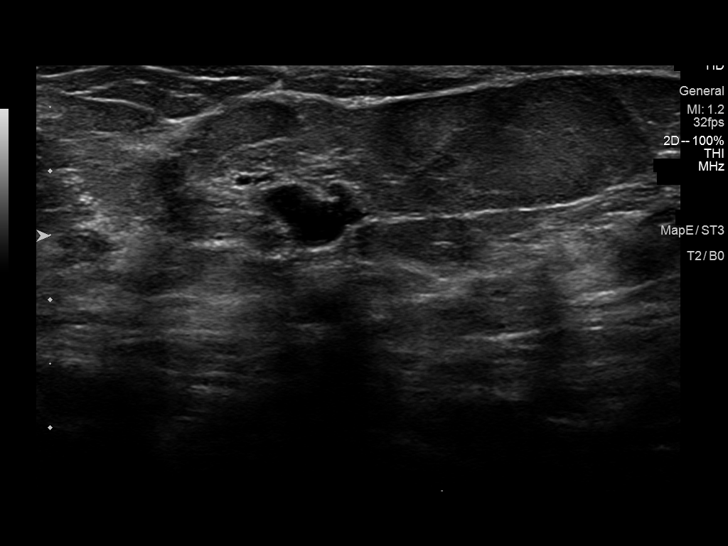
[im 3/18]
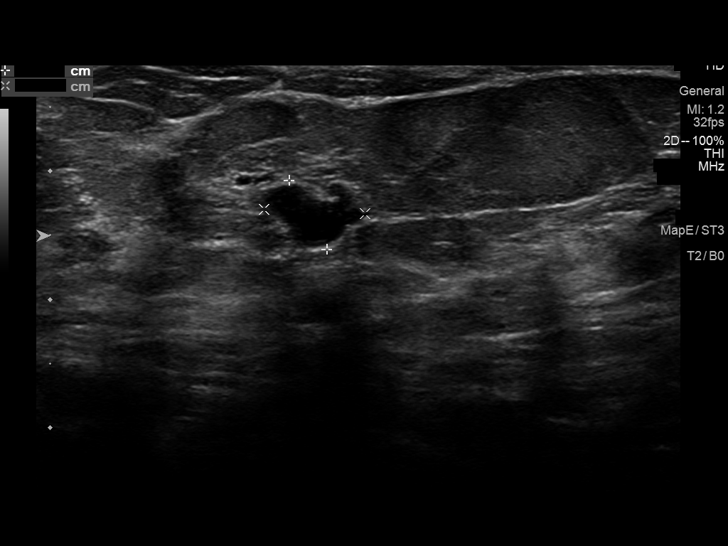
[im 4/18]
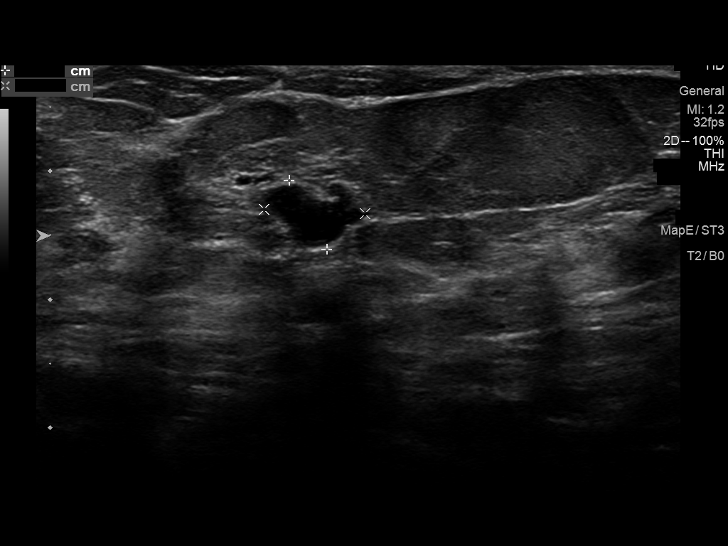
[im 6/18]
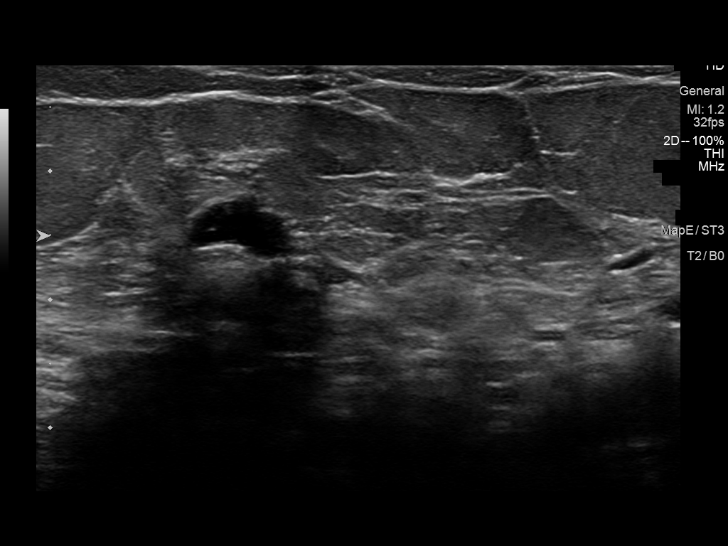
[im 7/18]
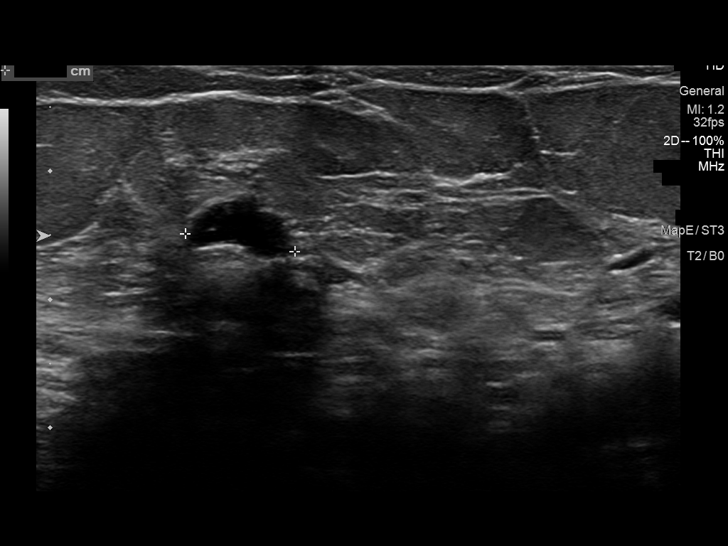
[im 9/18]
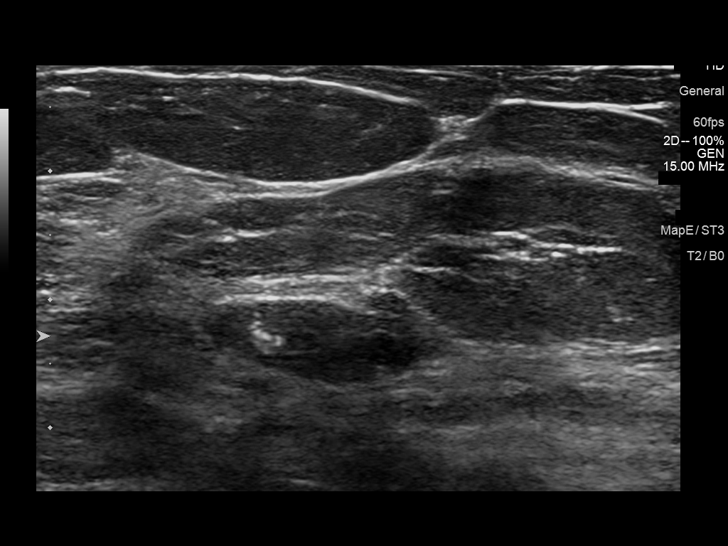
[im 10/18]
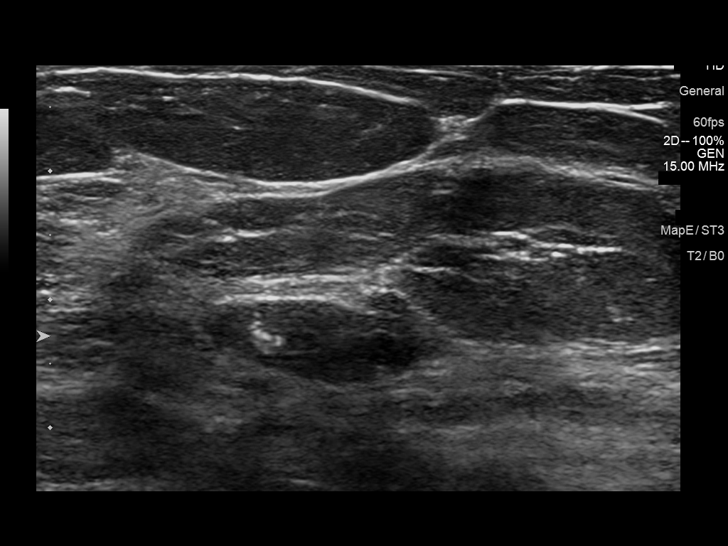
[im 12/18]
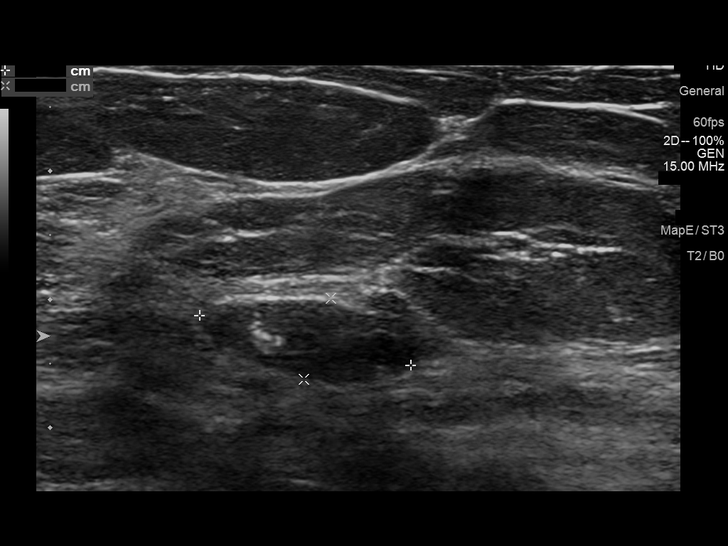
[im 13/18]
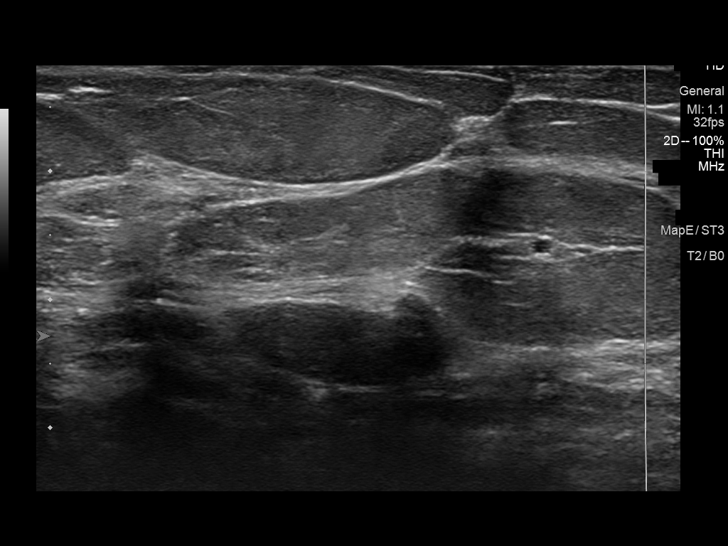
[im 15/18]
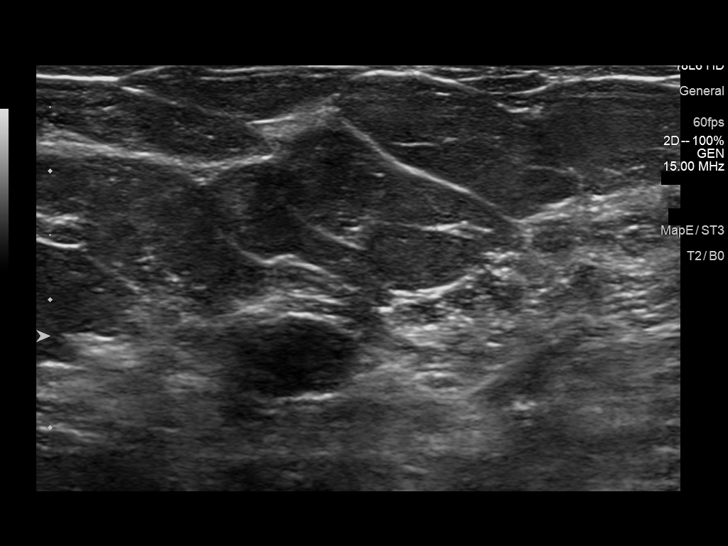
[im 16/18]
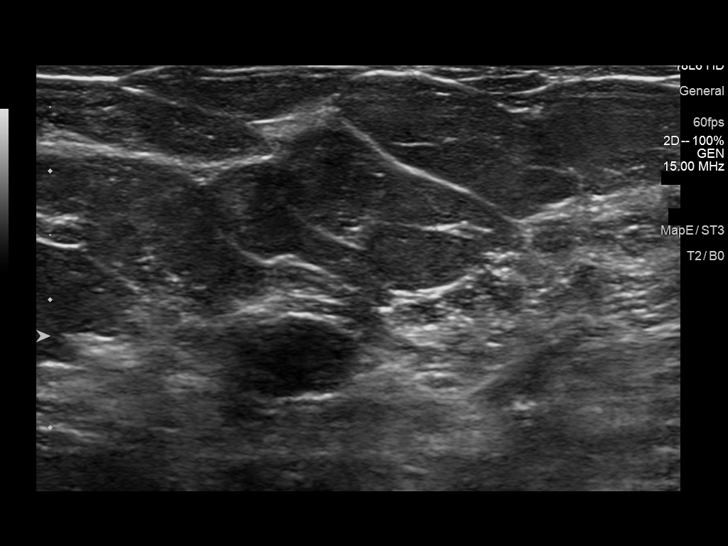
[im 18/18]
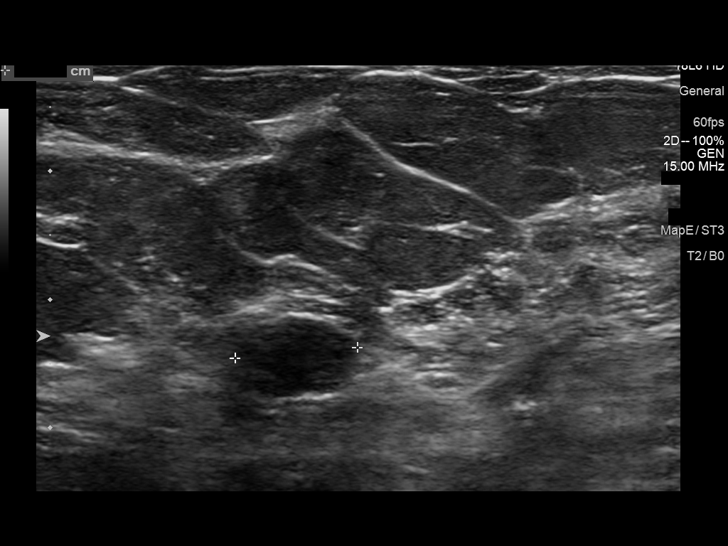

[12 of 18 positions shown; findings below may reference images not displayed]

BILATERAL breast ultrasound 02/07/2017, 08/06/2016. RIGHT
breast ultrasound 07/11/2015, 10/18/2013, 10/12/2012.

ACR Breast Density Category b: There are scattered areas of
fibroglandular density.
FINDINGS: Standard 2D and tomosynthesis full field CC and MLO views of both
breasts were obtained.

The circumscribed low-density mass in the OUTER RIGHT breast at
MIDDLE to POSTERIOR depth measuring approximately 1 cm is unchanged
since the mammogram 1 year ago. There is no associated architectural
distortion or suspicious calcifications. The circumscribed
low-density mass associated with calcifications, shown on remote
prior ultrasounds to represent a fibroadenoma, is also unchanged. No
new or suspicious findings in the RIGHT breast.

The circumscribed low-density mass in the OUTER LEFT breast at
MIDDLE depth measuring approximately 8 mm is unchanged since the
mammogram 1 year ago. No new or suspicious findings in the LEFT
breast.

Mammographic images were processed with CAD.

Targeted RIGHT breast ultrasound is performed, showing the
previously identified circumscribed parallel nearly anechoic mass
with gently lobulated margins at the 9 o'clock position
approximately 5 cm from the nipple at MIDDLE to POSTERIOR depth,
measuring approximately 0.6 x 0.8 x 0.9 cm, unchanged dating back to
July 2016. The oval circumscribed parallel hypoechoic mass at the
9 o'clock position approximately 6 cm from the nipple, containing
calcifications, measures approximately 0.7 x 1.7 x 1.0 cm, unchanged
dating back to October 2012, indicating benignity. No new suspicious
solid mass or abnormal acoustic shadowing is identified.

Targeted LEFT breast ultrasound is performed, showing the previously
identified circumscribed parallel nearly anechoic septated mass
containing echogenic material (or adjacent nearly anechoic masses),
measuring in total approximately 0.4 x 0.5 x 0.9 cm, unchanged
dating back to July 2016. No new suspicious solid mass or abnormal
acoustic shadowing is identified.
IMPRESSION: 1. Likely benign complex cysts in the OUTER RIGHT breast and in the
OUTER LEFT breast, stable dating back to July 2015.
2. Benign degenerating fibroadenoma with calcifications involving
the OUTER RIGHT breast at MIDDLE depth, stable dating back to 0670.

RECOMMENDATION:
BILATERAL breast ultrasound at the time of annual BILATERAL
diagnostic mammography in 1 year in order to confirm 2 years of
stability of the likely benign complex cysts.

I have discussed the findings and recommendations with the patient.
Communication with the patient was achieved with the assistance of a
certified sign language interpreter as the patient is deaf. Results
were also provided in writing at the conclusion of the visit. If
applicable, a reminder letter will be sent to the patient regarding
the next appointment.

BI-RADS CATEGORY  3: Probably benign.

## 2018-11-13 DIAGNOSIS — F250 Schizoaffective disorder, bipolar type: Secondary | ICD-10-CM | POA: Diagnosis not present

## 2018-12-12 DIAGNOSIS — F250 Schizoaffective disorder, bipolar type: Secondary | ICD-10-CM | POA: Diagnosis not present

## 2019-01-29 DIAGNOSIS — F250 Schizoaffective disorder, bipolar type: Secondary | ICD-10-CM | POA: Diagnosis not present

## 2019-01-30 DIAGNOSIS — F17200 Nicotine dependence, unspecified, uncomplicated: Secondary | ICD-10-CM | POA: Diagnosis not present

## 2019-01-30 DIAGNOSIS — E669 Obesity, unspecified: Secondary | ICD-10-CM | POA: Diagnosis not present

## 2019-01-30 DIAGNOSIS — E1169 Type 2 diabetes mellitus with other specified complication: Secondary | ICD-10-CM | POA: Diagnosis not present

## 2019-01-30 DIAGNOSIS — E1165 Type 2 diabetes mellitus with hyperglycemia: Secondary | ICD-10-CM | POA: Diagnosis not present

## 2019-02-01 DIAGNOSIS — F17200 Nicotine dependence, unspecified, uncomplicated: Secondary | ICD-10-CM | POA: Diagnosis not present

## 2019-02-01 DIAGNOSIS — F250 Schizoaffective disorder, bipolar type: Secondary | ICD-10-CM | POA: Diagnosis not present

## 2019-03-13 DIAGNOSIS — F250 Schizoaffective disorder, bipolar type: Secondary | ICD-10-CM | POA: Diagnosis not present

## 2019-04-17 DIAGNOSIS — F250 Schizoaffective disorder, bipolar type: Secondary | ICD-10-CM | POA: Diagnosis not present

## 2019-04-26 DIAGNOSIS — R942 Abnormal results of pulmonary function studies: Secondary | ICD-10-CM | POA: Diagnosis not present

## 2019-04-26 DIAGNOSIS — Z6839 Body mass index (BMI) 39.0-39.9, adult: Secondary | ICD-10-CM | POA: Diagnosis not present

## 2019-04-26 DIAGNOSIS — Z23 Encounter for immunization: Secondary | ICD-10-CM | POA: Diagnosis not present

## 2019-04-26 DIAGNOSIS — F17218 Nicotine dependence, cigarettes, with other nicotine-induced disorders: Secondary | ICD-10-CM | POA: Diagnosis not present

## 2019-04-26 DIAGNOSIS — R0600 Dyspnea, unspecified: Secondary | ICD-10-CM | POA: Diagnosis not present

## 2019-04-26 DIAGNOSIS — J439 Emphysema, unspecified: Secondary | ICD-10-CM | POA: Diagnosis not present

## 2019-05-18 DIAGNOSIS — F17200 Nicotine dependence, unspecified, uncomplicated: Secondary | ICD-10-CM | POA: Diagnosis not present

## 2019-05-18 DIAGNOSIS — F250 Schizoaffective disorder, bipolar type: Secondary | ICD-10-CM | POA: Diagnosis not present

## 2019-05-28 DIAGNOSIS — Z23 Encounter for immunization: Secondary | ICD-10-CM | POA: Diagnosis not present

## 2019-05-30 DIAGNOSIS — E669 Obesity, unspecified: Secondary | ICD-10-CM | POA: Diagnosis not present

## 2019-05-30 DIAGNOSIS — E1169 Type 2 diabetes mellitus with other specified complication: Secondary | ICD-10-CM | POA: Diagnosis not present

## 2019-05-30 DIAGNOSIS — Z3042 Encounter for surveillance of injectable contraceptive: Secondary | ICD-10-CM | POA: Diagnosis not present

## 2019-05-30 DIAGNOSIS — E1165 Type 2 diabetes mellitus with hyperglycemia: Secondary | ICD-10-CM | POA: Diagnosis not present

## 2019-06-05 DIAGNOSIS — D509 Iron deficiency anemia, unspecified: Secondary | ICD-10-CM | POA: Diagnosis not present

## 2019-06-05 DIAGNOSIS — I10 Essential (primary) hypertension: Secondary | ICD-10-CM | POA: Diagnosis not present

## 2019-06-05 DIAGNOSIS — H903 Sensorineural hearing loss, bilateral: Secondary | ICD-10-CM | POA: Diagnosis not present

## 2019-06-05 DIAGNOSIS — E785 Hyperlipidemia, unspecified: Secondary | ICD-10-CM | POA: Diagnosis not present

## 2019-06-05 DIAGNOSIS — E559 Vitamin D deficiency, unspecified: Secondary | ICD-10-CM | POA: Diagnosis not present

## 2019-06-05 DIAGNOSIS — J449 Chronic obstructive pulmonary disease, unspecified: Secondary | ICD-10-CM | POA: Diagnosis not present

## 2019-06-05 DIAGNOSIS — G603 Idiopathic progressive neuropathy: Secondary | ICD-10-CM | POA: Diagnosis not present

## 2019-06-05 DIAGNOSIS — E119 Type 2 diabetes mellitus without complications: Secondary | ICD-10-CM | POA: Diagnosis not present

## 2019-06-06 DIAGNOSIS — F17200 Nicotine dependence, unspecified, uncomplicated: Secondary | ICD-10-CM | POA: Diagnosis not present

## 2019-06-06 DIAGNOSIS — E669 Obesity, unspecified: Secondary | ICD-10-CM | POA: Diagnosis not present

## 2019-06-06 DIAGNOSIS — Z794 Long term (current) use of insulin: Secondary | ICD-10-CM | POA: Diagnosis not present

## 2019-06-06 DIAGNOSIS — E1165 Type 2 diabetes mellitus with hyperglycemia: Secondary | ICD-10-CM | POA: Diagnosis not present

## 2019-06-06 DIAGNOSIS — R809 Proteinuria, unspecified: Secondary | ICD-10-CM | POA: Diagnosis not present

## 2019-06-06 DIAGNOSIS — E1129 Type 2 diabetes mellitus with other diabetic kidney complication: Secondary | ICD-10-CM | POA: Diagnosis not present

## 2019-06-06 DIAGNOSIS — E1169 Type 2 diabetes mellitus with other specified complication: Secondary | ICD-10-CM | POA: Diagnosis not present

## 2019-06-27 DIAGNOSIS — F17200 Nicotine dependence, unspecified, uncomplicated: Secondary | ICD-10-CM | POA: Diagnosis not present

## 2019-06-27 DIAGNOSIS — F250 Schizoaffective disorder, bipolar type: Secondary | ICD-10-CM | POA: Diagnosis not present

## 2019-07-04 DIAGNOSIS — Z23 Encounter for immunization: Secondary | ICD-10-CM | POA: Diagnosis not present

## 2019-07-17 DIAGNOSIS — F250 Schizoaffective disorder, bipolar type: Secondary | ICD-10-CM | POA: Diagnosis not present

## 2019-07-26 ENCOUNTER — Other Ambulatory Visit: Payer: Self-pay | Admitting: Internal Medicine

## 2019-07-27 ENCOUNTER — Other Ambulatory Visit: Payer: Self-pay | Admitting: Internal Medicine

## 2019-07-27 DIAGNOSIS — Z1231 Encounter for screening mammogram for malignant neoplasm of breast: Secondary | ICD-10-CM

## 2019-08-14 DIAGNOSIS — F250 Schizoaffective disorder, bipolar type: Secondary | ICD-10-CM | POA: Diagnosis not present

## 2019-09-04 DIAGNOSIS — F250 Schizoaffective disorder, bipolar type: Secondary | ICD-10-CM | POA: Diagnosis not present

## 2019-09-07 DIAGNOSIS — E1165 Type 2 diabetes mellitus with hyperglycemia: Secondary | ICD-10-CM | POA: Diagnosis not present

## 2019-09-07 DIAGNOSIS — E1129 Type 2 diabetes mellitus with other diabetic kidney complication: Secondary | ICD-10-CM | POA: Diagnosis not present

## 2019-09-07 DIAGNOSIS — E669 Obesity, unspecified: Secondary | ICD-10-CM | POA: Diagnosis not present

## 2019-09-07 DIAGNOSIS — E1169 Type 2 diabetes mellitus with other specified complication: Secondary | ICD-10-CM | POA: Diagnosis not present

## 2019-09-07 DIAGNOSIS — Z794 Long term (current) use of insulin: Secondary | ICD-10-CM | POA: Diagnosis not present

## 2019-09-07 DIAGNOSIS — F17200 Nicotine dependence, unspecified, uncomplicated: Secondary | ICD-10-CM | POA: Diagnosis not present

## 2019-09-07 DIAGNOSIS — R809 Proteinuria, unspecified: Secondary | ICD-10-CM | POA: Diagnosis not present

## 2019-09-18 DIAGNOSIS — F250 Schizoaffective disorder, bipolar type: Secondary | ICD-10-CM | POA: Diagnosis not present

## 2019-09-18 DIAGNOSIS — F17200 Nicotine dependence, unspecified, uncomplicated: Secondary | ICD-10-CM | POA: Diagnosis not present

## 2019-10-11 ENCOUNTER — Ambulatory Visit
Admission: RE | Admit: 2019-10-11 | Discharge: 2019-10-11 | Disposition: A | Discharge status: Home / Self Care | Payer: Medicare Other | Source: Ambulatory Visit | Attending: Internal Medicine | Admitting: Internal Medicine

## 2019-10-11 DIAGNOSIS — Z1231 Encounter for screening mammogram for malignant neoplasm of breast: Secondary | ICD-10-CM

## 2019-10-30 DIAGNOSIS — F250 Schizoaffective disorder, bipolar type: Secondary | ICD-10-CM | POA: Diagnosis not present

## 2019-11-07 DIAGNOSIS — F17218 Nicotine dependence, cigarettes, with other nicotine-induced disorders: Secondary | ICD-10-CM | POA: Diagnosis not present

## 2019-11-07 DIAGNOSIS — R942 Abnormal results of pulmonary function studies: Secondary | ICD-10-CM | POA: Diagnosis not present

## 2019-11-07 DIAGNOSIS — F250 Schizoaffective disorder, bipolar type: Secondary | ICD-10-CM | POA: Diagnosis not present

## 2019-11-07 DIAGNOSIS — R0600 Dyspnea, unspecified: Secondary | ICD-10-CM | POA: Diagnosis not present

## 2019-11-07 DIAGNOSIS — F17200 Nicotine dependence, unspecified, uncomplicated: Secondary | ICD-10-CM | POA: Diagnosis not present

## 2019-11-07 DIAGNOSIS — J439 Emphysema, unspecified: Secondary | ICD-10-CM | POA: Diagnosis not present

## 2019-11-07 DIAGNOSIS — Z79899 Other long term (current) drug therapy: Secondary | ICD-10-CM | POA: Diagnosis not present

## 2019-11-07 DIAGNOSIS — E663 Overweight: Secondary | ICD-10-CM | POA: Diagnosis not present

## 2019-12-04 DIAGNOSIS — E669 Obesity, unspecified: Secondary | ICD-10-CM | POA: Diagnosis not present

## 2019-12-04 DIAGNOSIS — E119 Type 2 diabetes mellitus without complications: Secondary | ICD-10-CM | POA: Diagnosis not present

## 2019-12-04 DIAGNOSIS — D509 Iron deficiency anemia, unspecified: Secondary | ICD-10-CM | POA: Diagnosis not present

## 2019-12-04 DIAGNOSIS — I10 Essential (primary) hypertension: Secondary | ICD-10-CM | POA: Diagnosis not present

## 2019-12-04 DIAGNOSIS — E785 Hyperlipidemia, unspecified: Secondary | ICD-10-CM | POA: Diagnosis not present

## 2019-12-04 DIAGNOSIS — F250 Schizoaffective disorder, bipolar type: Secondary | ICD-10-CM | POA: Diagnosis not present

## 2019-12-04 DIAGNOSIS — F17209 Nicotine dependence, unspecified, with unspecified nicotine-induced disorders: Secondary | ICD-10-CM | POA: Diagnosis not present

## 2019-12-11 DIAGNOSIS — F250 Schizoaffective disorder, bipolar type: Secondary | ICD-10-CM | POA: Diagnosis not present

## 2019-12-11 DIAGNOSIS — F17200 Nicotine dependence, unspecified, uncomplicated: Secondary | ICD-10-CM | POA: Diagnosis not present

## 2019-12-12 DIAGNOSIS — F17200 Nicotine dependence, unspecified, uncomplicated: Secondary | ICD-10-CM | POA: Diagnosis not present

## 2019-12-12 DIAGNOSIS — E1169 Type 2 diabetes mellitus with other specified complication: Secondary | ICD-10-CM | POA: Diagnosis not present

## 2019-12-12 DIAGNOSIS — E669 Obesity, unspecified: Secondary | ICD-10-CM | POA: Diagnosis not present

## 2019-12-12 DIAGNOSIS — Z794 Long term (current) use of insulin: Secondary | ICD-10-CM | POA: Diagnosis not present

## 2019-12-12 DIAGNOSIS — R809 Proteinuria, unspecified: Secondary | ICD-10-CM | POA: Diagnosis not present

## 2019-12-12 DIAGNOSIS — E1129 Type 2 diabetes mellitus with other diabetic kidney complication: Secondary | ICD-10-CM | POA: Diagnosis not present

## 2019-12-12 DIAGNOSIS — E1165 Type 2 diabetes mellitus with hyperglycemia: Secondary | ICD-10-CM | POA: Diagnosis not present

## 2020-01-10 DIAGNOSIS — F250 Schizoaffective disorder, bipolar type: Secondary | ICD-10-CM | POA: Diagnosis not present

## 2020-02-08 DIAGNOSIS — F250 Schizoaffective disorder, bipolar type: Secondary | ICD-10-CM | POA: Diagnosis not present

## 2020-02-27 DIAGNOSIS — Z23 Encounter for immunization: Secondary | ICD-10-CM | POA: Diagnosis not present

## 2020-03-04 ENCOUNTER — Other Ambulatory Visit: Payer: Self-pay

## 2020-03-04 ENCOUNTER — Emergency Department (HOSPITAL_COMMUNITY): Payer: Medicare Other

## 2020-03-04 ENCOUNTER — Emergency Department (HOSPITAL_COMMUNITY)
Admission: EM | Admit: 2020-03-04 | Discharge: 2020-03-04 | Disposition: A | Discharge status: Home / Self Care | Payer: Medicare Other | Attending: Emergency Medicine | Admitting: Emergency Medicine

## 2020-03-04 DIAGNOSIS — Z7984 Long term (current) use of oral hypoglycemic drugs: Secondary | ICD-10-CM | POA: Diagnosis not present

## 2020-03-04 DIAGNOSIS — I10 Essential (primary) hypertension: Secondary | ICD-10-CM | POA: Diagnosis not present

## 2020-03-04 DIAGNOSIS — Z20822 Contact with and (suspected) exposure to covid-19: Secondary | ICD-10-CM | POA: Insufficient documentation

## 2020-03-04 DIAGNOSIS — R0602 Shortness of breath: Secondary | ICD-10-CM | POA: Diagnosis not present

## 2020-03-04 DIAGNOSIS — R4182 Altered mental status, unspecified: Secondary | ICD-10-CM | POA: Insufficient documentation

## 2020-03-04 DIAGNOSIS — J4541 Moderate persistent asthma with (acute) exacerbation: Secondary | ICD-10-CM | POA: Diagnosis not present

## 2020-03-04 DIAGNOSIS — F17210 Nicotine dependence, cigarettes, uncomplicated: Secondary | ICD-10-CM | POA: Diagnosis not present

## 2020-03-04 DIAGNOSIS — Z794 Long term (current) use of insulin: Secondary | ICD-10-CM | POA: Diagnosis not present

## 2020-03-04 DIAGNOSIS — J9811 Atelectasis: Secondary | ICD-10-CM | POA: Diagnosis not present

## 2020-03-04 DIAGNOSIS — R404 Transient alteration of awareness: Secondary | ICD-10-CM | POA: Diagnosis not present

## 2020-03-04 DIAGNOSIS — R0902 Hypoxemia: Secondary | ICD-10-CM | POA: Diagnosis not present

## 2020-03-04 DIAGNOSIS — J45901 Unspecified asthma with (acute) exacerbation: Secondary | ICD-10-CM

## 2020-03-04 DIAGNOSIS — Z79899 Other long term (current) drug therapy: Secondary | ICD-10-CM | POA: Insufficient documentation

## 2020-03-04 DIAGNOSIS — E1165 Type 2 diabetes mellitus with hyperglycemia: Secondary | ICD-10-CM | POA: Insufficient documentation

## 2020-03-04 DIAGNOSIS — R5381 Other malaise: Secondary | ICD-10-CM | POA: Diagnosis not present

## 2020-03-04 DIAGNOSIS — R059 Cough, unspecified: Secondary | ICD-10-CM | POA: Diagnosis not present

## 2020-03-04 DIAGNOSIS — Z7982 Long term (current) use of aspirin: Secondary | ICD-10-CM | POA: Insufficient documentation

## 2020-03-04 LAB — COMPREHENSIVE METABOLIC PANEL
ALT: 13 U/L (ref 0–44)
AST: 17 U/L (ref 15–41)
Albumin: 3.9 g/dL (ref 3.5–5.0)
Alkaline Phosphatase: 71 U/L (ref 38–126)
Anion gap: 9 (ref 5–15)
BUN: 11 mg/dL (ref 6–20)
CO2: 25 mmol/L (ref 22–32)
Calcium: 9.2 mg/dL (ref 8.9–10.3)
Chloride: 104 mmol/L (ref 98–111)
Creatinine, Ser: 0.62 mg/dL (ref 0.44–1.00)
GFR, Estimated: >60 mL/min (ref >60)
Glucose, Bld: 223 mg/dL — ABNORMAL HIGH (ref 70–99)
Potassium: 3.5 mmol/L (ref 3.5–5.1)
Sodium: 138 mmol/L (ref 135–145)
Total Bilirubin: 0.3 mg/dL (ref 0.3–1.2)
Total Protein: 7.5 g/dL (ref 6.5–8.1)

## 2020-03-04 LAB — CBC WITH DIFFERENTIAL/PLATELET
Abs Immature Granulocytes: 0.03 10*3/uL (ref 0.00–0.07)
Basophils Absolute: 0.1 10*3/uL (ref 0.0–0.1)
Basophils Relative: 1 %
Eosinophils Absolute: 0.1 10*3/uL (ref 0.0–0.5)
Eosinophils Relative: 1 %
HCT: 37.4 % (ref 36.0–46.0)
Hemoglobin: 11.3 g/dL — ABNORMAL LOW (ref 12.0–15.0)
Immature Granulocytes: 0 %
Lymphocytes Relative: 32 %
Lymphs Abs: 2.8 10*3/uL (ref 0.7–4.0)
MCH: 24.1 pg — ABNORMAL LOW (ref 26.0–34.0)
MCHC: 30.2 g/dL (ref 30.0–36.0)
MCV: 79.9 fL — ABNORMAL LOW (ref 80.0–100.0)
Monocytes Absolute: 0.5 10*3/uL (ref 0.1–1.0)
Monocytes Relative: 5 %
Neutro Abs: 5.4 10*3/uL (ref 1.7–7.7)
Neutrophils Relative %: 61 %
Platelets: 465 10*3/uL — ABNORMAL HIGH (ref 150–400)
RBC: 4.68 MIL/uL (ref 3.87–5.11)
RDW: 17.4 % — ABNORMAL HIGH (ref 11.5–15.5)
WBC: 8.9 10*3/uL (ref 4.0–10.5)
nRBC: 0 % (ref 0.0–0.2)

## 2020-03-04 LAB — RESP PANEL BY RT-PCR (FLU A&B, COVID) ARPGX2
Influenza A by PCR: NEGATIVE
Influenza B by PCR: NEGATIVE
SARS Coronavirus 2 by RT PCR: NEGATIVE

## 2020-03-04 LAB — CBG MONITORING, ED: Glucose-Capillary: 219 mg/dL — ABNORMAL HIGH (ref 70–99)

## 2020-03-04 MED ORDER — METHYLPREDNISOLONE SODIUM SUCC 125 MG IJ SOLR
125.0000 mg | Freq: Once | INTRAMUSCULAR | Status: AC
  Administered 2020-03-04: 125 mg via INTRAVENOUS
  Filled 2020-03-04: qty 2

## 2020-03-04 MED ORDER — ALBUTEROL SULFATE HFA 108 (90 BASE) MCG/ACT IN AERS
6.0000 | INHALATION_SPRAY | Freq: Once | RESPIRATORY_TRACT | Status: DC
  Filled 2020-03-04: qty 6.7

## 2020-03-04 MED ORDER — PREDNISONE 50 MG PO TABS: ORAL_TABLET | ORAL | 0 refills | Status: DC

## 2020-03-04 MED ORDER — ALBUTEROL SULFATE HFA 108 (90 BASE) MCG/ACT IN AERS
8.0000 | INHALATION_SPRAY | Freq: Once | RESPIRATORY_TRACT | Status: AC
  Administered 2020-03-04: 8 via RESPIRATORY_TRACT

## 2020-03-04 MED ORDER — IPRATROPIUM-ALBUTEROL 0.5-2.5 (3) MG/3ML IN SOLN
3.0000 mL | Freq: Once | RESPIRATORY_TRACT | Status: AC
  Administered 2020-03-04: 3 mL via RESPIRATORY_TRACT
  Filled 2020-03-04: qty 3

## 2020-03-04 NOTE — ED Triage Notes (Signed)
Pt arrived [facility]. Pt from [facility] in [place]. Pt sent for being hard to arouse from her normal. Pt is deaf and sleeps a lot per EMS. Pt has a productive cough.

## 2020-03-04 NOTE — ED Provider Notes (Signed)
[facility] EMERGENCY DEPARTMENT Provider Note   CSN: [id number] Arrival date & time: 03/04/20  1408     History Chief Complaint  Patient presents with   Altered Mental Status    Brooklinn Longbottom is a 49 y.o. female.  The history is provided by the patient. The history is limited by a language barrier. A language interpreter was used.  Shortness of Breath Severity:  Moderate Onset quality:  Gradual Timing:  Constant Progression:  Worsening Chronicity:  New Relieved by:  Nothing Worsened by:  Nothing Ineffective treatments:  None tried Associated symptoms: no fever   Pt complains of shortness of breath.  Pt is reported to be sleepier than usual and not as active.       Past Medical History:  Diagnosis Date   Chronic constipation    Depression    Diabetes mellitus without complication (HCC)    Gastritis    GERD (gastroesophageal reflux disease)    Hypertension     Patient Active Problem List   Diagnosis Date Noted   Uncontrolled type 2 diabetes mellitus with hyperglycemia, with long-term current use of insulin (HCC) 06/17/2016   Multiple lung nodules 02/05/2016   Iron deficiency anemia due to chronic blood loss 02/07/2015   Bilateral deafness 05/17/2014   Long term current use of insulin (HCC) 05/17/2014   Type 2 diabetes mellitus (HCC) 05/17/2014   Cough 10/23/2013   Current tobacco use 10/23/2013    Past Surgical History:  Procedure Laterality Date   COLONOSCOPY N/A 09/06/2014   Procedure: COLONOSCOPY;  Surgeon: Elnita Maxwell, MD;  Location: [facility] ENDOSCOPY;  Service: Endoscopy;  Laterality: N/A;     OB History   No obstetric history on file.     No family history on file.  Social History   Tobacco Use   Smoking status: Current Every Day Smoker    Packs/day: 4.00    Years: 3.00    Pack years: 12.00    Types: Cigarettes   Smokeless tobacco: Never Used  Vaping Use   Vaping Use: Never used  Substance Use Topics   Alcohol  use: No    Alcohol/week: 0.0 standard drinks   Drug use: No    Home Medications Prior to Admission medications   Medication Sig Start Date End Date Taking? Authorizing Provider  aspirin EC 81 MG tablet Take 81 mg by mouth daily.   Yes [provider]  atenolol (TENORMIN) 25 MG tablet Take 25 mg by mouth daily.    Yes [provider]  B Complex-Folic Acid (B COMPLEX-VITAMIN B12 PO) Take 1 tablet by mouth daily.   Yes [provider]  benztropine (COGENTIN) 1 MG tablet Take 1 mg by mouth daily.    Yes [provider]  budesonide-formoterol (SYMBICORT) 160-4.5 MCG/ACT inhaler Inhale 2 puffs into the lungs 2 (two) times daily.   Yes [provider]  cetirizine (ZYRTEC) 10 MG tablet Take 10 mg by mouth daily.   Yes [provider]  Cholecalciferol (VITAMIN D-3) 125 MCG (5000 UT) TABS Take 5,000 Units by mouth daily.   Yes [provider]  clotrimazole (CLOTRIMAZOLE ANTI-FUNGAL) 1 % cream Apply 1 application topically 2 (two) times daily. 01/31/18  Yes Freddie Breech, DPM  insulin aspart (NOVOLOG FLEXPEN) 100 UNIT/ML FlexPen Inject 8 Units into the skin 3 (three) times daily with meals. Per sliding scale   Yes [provider]  Insulin Glargine (TOUJEO SOLOSTAR) 300 UNIT/ML SOPN Inject 30 Units into the skin daily. Between 8pm-10pm  Yes [provider]  iron polysaccharides (NIFEREX) 150 MG capsule Take 150 mg by mouth daily.   Yes [provider]  lisinopril (PRINIVIL,ZESTRIL) 10 MG tablet Take 5 mg by mouth daily.   Yes [provider]  lithium carbonate (ESKALITH) 450 MG CR tablet Take 450 mg by mouth at bedtime.   Yes [provider]  medroxyPROGESTERone (DEPO-PROVERA) 150 MG/ML injection Inject 150 mg into the muscle every 3 (three) months.   Yes [provider]  metFORMIN (GLUCOPHAGE) 1000 MG tablet Take 1,000 mg by mouth 2 (two) times daily with a meal.   Yes [provider]  montelukast (SINGULAIR) 10 MG tablet Take 10 mg by mouth at bedtime.   Yes [provider]  omeprazole (PRILOSEC) 40 MG capsule Take 40 mg by mouth daily.   Yes [provider]  simvastatin (ZOCOR) 20 MG tablet Take 20 mg by mouth daily.    Yes [provider]  albuterol (PROVENTIL HFA;VENTOLIN HFA) 108 (90 BASE) MCG/ACT inhaler Inhale 2 puffs into the lungs every 6 (six) hours as needed for wheezing or shortness of breath.    [provider]  cloZAPine (CLOZARIL) 100 MG tablet Take 100 mg by mouth in the morning and at bedtime. 1 tablet in the morning and 2 tablets before bed    [provider]  Exenatide ER (BYDUREON) 2 MG PEN INJECT 2mg /0.49ml SUBCUTANEOUSLY EVERY 7 DAYS. Patient not taking: No sig reported 08/24/17   [provider]  Insulin Pen Needle (FIFTY50 PEN NEEDLES) 31G X 8 MM MISC USE 4 TIMES DAILY 03/11/17   [provider]  Multiple Vitamin (MULTIVITAMIN) tablet Take 1 tablet by mouth daily.    [provider]  sertraline (ZOLOFT) 100 MG tablet Take 100 mg by mouth daily.    [provider]  VICTOZA 18 MG/3ML SOPN Inject 18 mg into the skin daily. 03/04/20   [provider]    Allergies    Patient has no known allergies.  Review of Systems   Review of Systems  Constitutional: Negative for fever.  Respiratory: Positive for shortness of breath.   All other systems reviewed and are negative.   Physical Exam Updated Vital Signs BP (!) 143/59    Pulse 91    Temp 98.3 F (36.8 C) (Oral)    Resp (!) 24    Wt 93.4 kg    SpO2 96%    BMI 34.27 kg/m   Physical Exam Vitals and nursing note reviewed.  Constitutional:      Appearance: She is well-developed.  HENT:     Head: Normocephalic.     Mouth/Throat:     Mouth: Mucous membranes are moist.  Eyes:     Pupils: Pupils are equal, round, and reactive to light.  Cardiovascular:     Rate and Rhythm: Normal rate.    Pulmonary:     Breath sounds: Wheezing present.  Abdominal:     General: There is no distension.  Musculoskeletal:        General: Normal range of motion.     Cervical back: Normal range of motion.  Skin:    General: Skin is warm.  Neurological:     Mental Status: She is alert and oriented to person, place, and time.  Psychiatric:        Mood and Affect: Mood normal.     ED Results / Procedures / Treatments   Labs (all labs ordered are listed, but only abnormal results are  displayed) Labs Reviewed  CBC WITH DIFFERENTIAL/PLATELET - Abnormal; Notable for the following components:      Result Value   Hemoglobin 11.3 (*)    MCV 79.9 (*)    MCH 24.1 (*)    RDW 17.4 (*)    Platelets 465 (*)    All other components within normal limits  COMPREHENSIVE METABOLIC PANEL - Abnormal; Notable for the following components:   Glucose, Bld 223 (*)    All other components within normal limits  CBG MONITORING, ED - Abnormal; Notable for the following components:   Glucose-Capillary 219 (*)    All other components within normal limits  RESP PANEL BY RT-PCR (FLU A&B, COVID) [id number]    EKG None  Radiology DG Chest Port 1 View  Result Date: 03/04/2020 CLINICAL DATA:  Cough EXAM: PORTABLE CHEST 1 VIEW COMPARISON:  08/24/2017 FINDINGS: Heart is borderline in size. Low lung volumes with bibasilar atelectasis. No effusions or edema. No acute bony abnormality. IMPRESSION: Low volumes with bibasilar atelectasis. Electronically Signed   By: Charlett Nose M.D.   On: 03/04/2020 15:29    Procedures Procedures (including critical care time)  Medications Ordered in ED Medications  albuterol (VENTOLIN HFA) 108 (90 Base) MCG/ACT inhaler 6 puff (6 puffs Inhalation Not Given 03/04/20 1446)  albuterol (VENTOLIN HFA) 108 (90 Base) MCG/ACT inhaler 8 puff (8 puffs Inhalation Given 03/04/20 1504)  methylPREDNISolone sodium succinate (SOLU-MEDROL) 125 mg/2 mL injection 125 mg (125 mg Intravenous Given  03/04/20 1524)  ipratropium-albuterol (DUONEB) 0.5-2.5 (3) MG/3ML nebulizer solution 3 mL (3 mLs Nebulization Given 03/04/20 1641)    ED Course  I have reviewed the triage vital signs and the nursing notes.  Pertinent labs & imaging results that were available during my care of the patient were reviewed by me and considered in my medical decision making (see chart for details).    MDM Rules/Calculators/A&P                          MDM:  Pt given 8 puffs of albuterol,  covid and influenza are negative.  Chest xray no pneumonia.  Pt given solumedrol IV.   Pt feels better, lungs clear, no wheezing.   Final Clinical Impression(s) / ED Diagnoses Final diagnoses:  Moderate asthma with exacerbation, unspecified whether persistent    Rx / DC Orders ED Discharge Orders         Ordered    predniSONE (DELTASONE) 50 MG tablet        03/04/20 1742        An After Visit Summary was printed and given to the patient.    Osie Cheeks 03/04/20 1743    Eber Hong, MD 03/06/20 2044

## 2020-03-11 DIAGNOSIS — F250 Schizoaffective disorder, bipolar type: Secondary | ICD-10-CM | POA: Diagnosis not present

## 2020-03-18 DIAGNOSIS — R809 Proteinuria, unspecified: Secondary | ICD-10-CM | POA: Diagnosis not present

## 2020-03-18 DIAGNOSIS — Z794 Long term (current) use of insulin: Secondary | ICD-10-CM | POA: Diagnosis not present

## 2020-03-18 DIAGNOSIS — E1169 Type 2 diabetes mellitus with other specified complication: Secondary | ICD-10-CM | POA: Diagnosis not present

## 2020-03-18 DIAGNOSIS — F17200 Nicotine dependence, unspecified, uncomplicated: Secondary | ICD-10-CM | POA: Diagnosis not present

## 2020-03-18 DIAGNOSIS — E1129 Type 2 diabetes mellitus with other diabetic kidney complication: Secondary | ICD-10-CM | POA: Diagnosis not present

## 2020-03-18 DIAGNOSIS — E1165 Type 2 diabetes mellitus with hyperglycemia: Secondary | ICD-10-CM | POA: Diagnosis not present

## 2020-03-18 DIAGNOSIS — E669 Obesity, unspecified: Secondary | ICD-10-CM | POA: Diagnosis not present

## 2020-03-19 DIAGNOSIS — F250 Schizoaffective disorder, bipolar type: Secondary | ICD-10-CM | POA: Diagnosis not present

## 2020-03-19 DIAGNOSIS — F17200 Nicotine dependence, unspecified, uncomplicated: Secondary | ICD-10-CM | POA: Diagnosis not present

## 2020-04-10 DIAGNOSIS — F250 Schizoaffective disorder, bipolar type: Secondary | ICD-10-CM | POA: Diagnosis not present

## 2020-05-08 DIAGNOSIS — Z308 Encounter for other contraceptive management: Secondary | ICD-10-CM | POA: Diagnosis not present

## 2020-05-13 DIAGNOSIS — F250 Schizoaffective disorder, bipolar type: Secondary | ICD-10-CM | POA: Diagnosis not present

## 2020-05-19 ENCOUNTER — Other Ambulatory Visit: Payer: Self-pay | Admitting: Specialist

## 2020-05-19 ENCOUNTER — Other Ambulatory Visit (HOSPITAL_COMMUNITY): Payer: Self-pay | Admitting: Specialist

## 2020-05-19 DIAGNOSIS — R0600 Dyspnea, unspecified: Secondary | ICD-10-CM

## 2020-05-19 DIAGNOSIS — R0609 Other forms of dyspnea: Secondary | ICD-10-CM

## 2020-05-19 DIAGNOSIS — R059 Cough, unspecified: Secondary | ICD-10-CM

## 2020-05-19 DIAGNOSIS — J439 Emphysema, unspecified: Secondary | ICD-10-CM

## 2020-05-19 DIAGNOSIS — Z6839 Body mass index (BMI) 39.0-39.9, adult: Secondary | ICD-10-CM | POA: Diagnosis not present

## 2020-05-19 DIAGNOSIS — F17210 Nicotine dependence, cigarettes, uncomplicated: Secondary | ICD-10-CM | POA: Diagnosis not present

## 2020-05-28 ENCOUNTER — Ambulatory Visit: Payer: Medicare Other

## 2020-06-05 ENCOUNTER — Other Ambulatory Visit: Payer: Self-pay | Admitting: Nurse Practitioner

## 2020-06-05 DIAGNOSIS — Z1231 Encounter for screening mammogram for malignant neoplasm of breast: Secondary | ICD-10-CM

## 2020-06-05 DIAGNOSIS — E785 Hyperlipidemia, unspecified: Secondary | ICD-10-CM | POA: Diagnosis not present

## 2020-06-05 DIAGNOSIS — I10 Essential (primary) hypertension: Secondary | ICD-10-CM | POA: Diagnosis not present

## 2020-06-05 DIAGNOSIS — R5383 Other fatigue: Secondary | ICD-10-CM | POA: Diagnosis not present

## 2020-06-05 DIAGNOSIS — R0600 Dyspnea, unspecified: Secondary | ICD-10-CM | POA: Diagnosis not present

## 2020-06-05 DIAGNOSIS — E559 Vitamin D deficiency, unspecified: Secondary | ICD-10-CM | POA: Diagnosis not present

## 2020-06-05 DIAGNOSIS — F209 Schizophrenia, unspecified: Secondary | ICD-10-CM | POA: Diagnosis not present

## 2020-06-05 DIAGNOSIS — E119 Type 2 diabetes mellitus without complications: Secondary | ICD-10-CM | POA: Diagnosis not present

## 2020-06-09 DIAGNOSIS — F17200 Nicotine dependence, unspecified, uncomplicated: Secondary | ICD-10-CM | POA: Diagnosis not present

## 2020-06-09 DIAGNOSIS — F250 Schizoaffective disorder, bipolar type: Secondary | ICD-10-CM | POA: Diagnosis not present

## 2020-06-10 DIAGNOSIS — B353 Tinea pedis: Secondary | ICD-10-CM | POA: Diagnosis not present

## 2020-06-10 DIAGNOSIS — M2041 Other hammer toe(s) (acquired), right foot: Secondary | ICD-10-CM | POA: Diagnosis not present

## 2020-06-10 DIAGNOSIS — E1142 Type 2 diabetes mellitus with diabetic polyneuropathy: Secondary | ICD-10-CM | POA: Diagnosis not present

## 2020-06-13 DIAGNOSIS — F250 Schizoaffective disorder, bipolar type: Secondary | ICD-10-CM | POA: Diagnosis not present

## 2020-06-24 ENCOUNTER — Other Ambulatory Visit: Payer: Self-pay

## 2020-06-24 ENCOUNTER — Encounter (HOSPITAL_COMMUNITY): Payer: Self-pay

## 2020-06-24 ENCOUNTER — Ambulatory Visit (HOSPITAL_COMMUNITY)
Admission: RE | Admit: 2020-06-24 | Discharge: 2020-06-24 | Disposition: A | Discharge status: Home / Self Care | Payer: Medicare Other | Source: Ambulatory Visit | Attending: Specialist | Admitting: Specialist

## 2020-06-24 DIAGNOSIS — J439 Emphysema, unspecified: Secondary | ICD-10-CM | POA: Diagnosis not present

## 2020-06-24 DIAGNOSIS — R0600 Dyspnea, unspecified: Secondary | ICD-10-CM | POA: Insufficient documentation

## 2020-06-24 DIAGNOSIS — R0602 Shortness of breath: Secondary | ICD-10-CM | POA: Diagnosis not present

## 2020-06-24 DIAGNOSIS — R0609 Other forms of dyspnea: Secondary | ICD-10-CM

## 2020-06-24 DIAGNOSIS — R059 Cough, unspecified: Secondary | ICD-10-CM | POA: Diagnosis not present

## 2020-06-24 DIAGNOSIS — E1165 Type 2 diabetes mellitus with hyperglycemia: Secondary | ICD-10-CM | POA: Diagnosis not present

## 2020-07-01 DIAGNOSIS — R809 Proteinuria, unspecified: Secondary | ICD-10-CM | POA: Diagnosis not present

## 2020-07-01 DIAGNOSIS — Z794 Long term (current) use of insulin: Secondary | ICD-10-CM | POA: Diagnosis not present

## 2020-07-01 DIAGNOSIS — E1129 Type 2 diabetes mellitus with other diabetic kidney complication: Secondary | ICD-10-CM | POA: Diagnosis not present

## 2020-07-01 DIAGNOSIS — F17200 Nicotine dependence, unspecified, uncomplicated: Secondary | ICD-10-CM | POA: Diagnosis not present

## 2020-07-01 DIAGNOSIS — Z716 Tobacco abuse counseling: Secondary | ICD-10-CM | POA: Diagnosis not present

## 2020-07-01 DIAGNOSIS — R0600 Dyspnea, unspecified: Secondary | ICD-10-CM | POA: Diagnosis not present

## 2020-07-01 DIAGNOSIS — J439 Emphysema, unspecified: Secondary | ICD-10-CM | POA: Diagnosis not present

## 2020-07-01 DIAGNOSIS — E1169 Type 2 diabetes mellitus with other specified complication: Secondary | ICD-10-CM | POA: Diagnosis not present

## 2020-07-01 DIAGNOSIS — E669 Obesity, unspecified: Secondary | ICD-10-CM | POA: Diagnosis not present

## 2020-07-01 DIAGNOSIS — E1165 Type 2 diabetes mellitus with hyperglycemia: Secondary | ICD-10-CM | POA: Diagnosis not present

## 2020-07-15 DIAGNOSIS — F250 Schizoaffective disorder, bipolar type: Secondary | ICD-10-CM | POA: Diagnosis not present

## 2020-08-08 DIAGNOSIS — Z308 Encounter for other contraceptive management: Secondary | ICD-10-CM | POA: Diagnosis not present

## 2020-08-25 DIAGNOSIS — F17200 Nicotine dependence, unspecified, uncomplicated: Secondary | ICD-10-CM | POA: Diagnosis not present

## 2020-08-25 DIAGNOSIS — F250 Schizoaffective disorder, bipolar type: Secondary | ICD-10-CM | POA: Diagnosis not present

## 2020-09-10 DIAGNOSIS — F250 Schizoaffective disorder, bipolar type: Secondary | ICD-10-CM | POA: Diagnosis not present

## 2020-10-10 DIAGNOSIS — F250 Schizoaffective disorder, bipolar type: Secondary | ICD-10-CM | POA: Diagnosis not present

## 2020-10-14 ENCOUNTER — Ambulatory Visit
Admission: RE | Admit: 2020-10-14 | Discharge: 2020-10-14 | Disposition: A | Discharge status: Home / Self Care | Payer: Medicare Other | Source: Ambulatory Visit | Attending: Nurse Practitioner | Admitting: Nurse Practitioner

## 2020-10-14 ENCOUNTER — Other Ambulatory Visit: Payer: Self-pay

## 2020-10-14 DIAGNOSIS — Z1231 Encounter for screening mammogram for malignant neoplasm of breast: Secondary | ICD-10-CM | POA: Insufficient documentation

## 2020-11-05 DIAGNOSIS — Z308 Encounter for other contraceptive management: Secondary | ICD-10-CM | POA: Diagnosis not present

## 2020-11-13 DIAGNOSIS — F250 Schizoaffective disorder, bipolar type: Secondary | ICD-10-CM | POA: Diagnosis not present

## 2020-11-17 DIAGNOSIS — F250 Schizoaffective disorder, bipolar type: Secondary | ICD-10-CM | POA: Diagnosis not present

## 2020-11-17 DIAGNOSIS — F17200 Nicotine dependence, unspecified, uncomplicated: Secondary | ICD-10-CM | POA: Diagnosis not present

## 2020-12-03 DIAGNOSIS — J302 Other seasonal allergic rhinitis: Secondary | ICD-10-CM | POA: Diagnosis not present

## 2020-12-03 DIAGNOSIS — R5383 Other fatigue: Secondary | ICD-10-CM | POA: Diagnosis not present

## 2020-12-03 DIAGNOSIS — Z202 Contact with and (suspected) exposure to infections with a predominantly sexual mode of transmission: Secondary | ICD-10-CM | POA: Diagnosis not present

## 2020-12-03 DIAGNOSIS — E1165 Type 2 diabetes mellitus with hyperglycemia: Secondary | ICD-10-CM | POA: Diagnosis not present

## 2020-12-03 DIAGNOSIS — I10 Essential (primary) hypertension: Secondary | ICD-10-CM | POA: Diagnosis not present

## 2020-12-03 DIAGNOSIS — E785 Hyperlipidemia, unspecified: Secondary | ICD-10-CM | POA: Diagnosis not present

## 2020-12-03 DIAGNOSIS — F17209 Nicotine dependence, unspecified, with unspecified nicotine-induced disorders: Secondary | ICD-10-CM | POA: Diagnosis not present

## 2020-12-03 DIAGNOSIS — F209 Schizophrenia, unspecified: Secondary | ICD-10-CM | POA: Diagnosis not present

## 2020-12-03 DIAGNOSIS — E119 Type 2 diabetes mellitus without complications: Secondary | ICD-10-CM | POA: Diagnosis not present

## 2020-12-09 ENCOUNTER — Encounter: Payer: Self-pay | Admitting: Nurse Practitioner

## 2020-12-12 DIAGNOSIS — F250 Schizoaffective disorder, bipolar type: Secondary | ICD-10-CM | POA: Diagnosis not present

## 2020-12-16 DIAGNOSIS — E1142 Type 2 diabetes mellitus with diabetic polyneuropathy: Secondary | ICD-10-CM | POA: Diagnosis not present

## 2020-12-16 DIAGNOSIS — M2041 Other hammer toe(s) (acquired), right foot: Secondary | ICD-10-CM | POA: Diagnosis not present

## 2021-01-05 DIAGNOSIS — Z23 Encounter for immunization: Secondary | ICD-10-CM | POA: Diagnosis not present

## 2021-01-13 DIAGNOSIS — E1129 Type 2 diabetes mellitus with other diabetic kidney complication: Secondary | ICD-10-CM | POA: Diagnosis not present

## 2021-01-13 DIAGNOSIS — E669 Obesity, unspecified: Secondary | ICD-10-CM | POA: Diagnosis not present

## 2021-01-13 DIAGNOSIS — E1165 Type 2 diabetes mellitus with hyperglycemia: Secondary | ICD-10-CM | POA: Diagnosis not present

## 2021-01-13 DIAGNOSIS — R809 Proteinuria, unspecified: Secondary | ICD-10-CM | POA: Diagnosis not present

## 2021-01-13 DIAGNOSIS — E1169 Type 2 diabetes mellitus with other specified complication: Secondary | ICD-10-CM | POA: Diagnosis not present

## 2021-01-13 DIAGNOSIS — Z794 Long term (current) use of insulin: Secondary | ICD-10-CM | POA: Diagnosis not present

## 2021-01-13 DIAGNOSIS — F17210 Nicotine dependence, cigarettes, uncomplicated: Secondary | ICD-10-CM | POA: Diagnosis not present

## 2021-01-14 DIAGNOSIS — F250 Schizoaffective disorder, bipolar type: Secondary | ICD-10-CM | POA: Diagnosis not present

## 2021-02-02 DIAGNOSIS — F17200 Nicotine dependence, unspecified, uncomplicated: Secondary | ICD-10-CM | POA: Diagnosis not present

## 2021-02-02 DIAGNOSIS — F250 Schizoaffective disorder, bipolar type: Secondary | ICD-10-CM | POA: Diagnosis not present

## 2021-02-13 DIAGNOSIS — F250 Schizoaffective disorder, bipolar type: Secondary | ICD-10-CM | POA: Diagnosis not present

## 2021-03-13 DIAGNOSIS — F250 Schizoaffective disorder, bipolar type: Secondary | ICD-10-CM | POA: Diagnosis not present

## 2021-03-31 DIAGNOSIS — Z308 Encounter for other contraceptive management: Secondary | ICD-10-CM | POA: Diagnosis not present

## 2021-04-08 DIAGNOSIS — J449 Chronic obstructive pulmonary disease, unspecified: Secondary | ICD-10-CM | POA: Diagnosis not present

## 2021-04-08 DIAGNOSIS — R059 Cough, unspecified: Secondary | ICD-10-CM | POA: Diagnosis not present

## 2021-04-08 DIAGNOSIS — R0609 Other forms of dyspnea: Secondary | ICD-10-CM | POA: Diagnosis not present

## 2021-09-16 ENCOUNTER — Other Ambulatory Visit: Payer: Self-pay | Admitting: Nurse Practitioner

## 2021-09-16 DIAGNOSIS — Z1231 Encounter for screening mammogram for malignant neoplasm of breast: Secondary | ICD-10-CM

## 2021-10-15 ENCOUNTER — Ambulatory Visit
Admission: RE | Admit: 2021-10-15 | Discharge: 2021-10-15 | Disposition: A | Discharge status: Home / Self Care | Payer: Medicare Other | Source: Ambulatory Visit | Attending: Nurse Practitioner | Admitting: Nurse Practitioner

## 2021-10-15 DIAGNOSIS — Z1231 Encounter for screening mammogram for malignant neoplasm of breast: Secondary | ICD-10-CM | POA: Insufficient documentation

## 2021-11-04 ENCOUNTER — Ambulatory Visit
Admission: RE | Admit: 2021-11-04 | Discharge: 2021-11-04 | Disposition: A | Discharge status: Home / Self Care | Payer: Medicare Other | Source: Ambulatory Visit | Attending: Nurse Practitioner | Admitting: Nurse Practitioner

## 2021-11-04 DIAGNOSIS — Z1231 Encounter for screening mammogram for malignant neoplasm of breast: Secondary | ICD-10-CM | POA: Insufficient documentation

## 2022-05-22 ENCOUNTER — Encounter: Payer: Self-pay | Admitting: Internal Medicine

## 2022-06-30 ENCOUNTER — Other Ambulatory Visit: Payer: Self-pay | Admitting: Nurse Practitioner

## 2022-07-01 LAB — CBC WITH DIFFERENTIAL/PLATELET
Basophils Absolute: 0.1 10*3/uL (ref 0.0–0.2)
Basos: 1 %
EOS (ABSOLUTE): 0.2 10*3/uL (ref 0.0–0.4)
Eos: 2 %
Hematocrit: 39.6 % (ref 34.0–46.6)
Hemoglobin: 12.4 g/dL (ref 11.1–15.9)
Immature Grans (Abs): 0 10*3/uL (ref 0.0–0.1)
Immature Granulocytes: 0 %
Lymphocytes Absolute: 3.8 10*3/uL — ABNORMAL HIGH (ref 0.7–3.1)
Lymphs: 37 %
MCH: 23.7 pg — ABNORMAL LOW (ref 26.6–33.0)
MCHC: 31.3 g/dL — ABNORMAL LOW (ref 31.5–35.7)
MCV: 76 fL — ABNORMAL LOW (ref 79–97)
Monocytes Absolute: 0.5 10*3/uL (ref 0.1–0.9)
Monocytes: 5 %
Neutrophils Absolute: 5.7 10*3/uL (ref 1.4–7.0)
Neutrophils: 55 %
Platelets: 492 10*3/uL — ABNORMAL HIGH (ref 150–450)
RBC: 5.23 x10E6/uL (ref 3.77–5.28)
RDW: 14.7 % (ref 11.7–15.4)
WBC: 10.3 10*3/uL (ref 3.4–10.8)

## 2022-07-01 LAB — COMPREHENSIVE METABOLIC PANEL
ALT: 19 IU/L (ref 0–32)
AST: 15 IU/L (ref 0–40)
Albumin/Globulin Ratio: 1.4 (ref 1.2–2.2)
Albumin: 4.2 g/dL (ref 3.9–4.9)
Alkaline Phosphatase: 106 IU/L (ref 44–121)
BUN/Creatinine Ratio: 10 (ref 9–23)
BUN: 9 mg/dL (ref 6–24)
Bilirubin Total: 0.2 mg/dL (ref 0.0–1.2)
CO2: 21 mmol/L (ref 20–29)
Calcium: 10.3 mg/dL — ABNORMAL HIGH (ref 8.7–10.2)
Chloride: 105 mmol/L (ref 96–106)
Creatinine, Ser: 0.87 mg/dL (ref 0.57–1.00)
Globulin, Total: 2.9 g/dL (ref 1.5–4.5)
Glucose: 40 mg/dL — ABNORMAL LOW (ref 70–99)
Potassium: 4.6 mmol/L (ref 3.5–5.2)
Sodium: 143 mmol/L (ref 134–144)
Total Protein: 7.1 g/dL (ref 6.0–8.5)
eGFR: 81 mL/min/{1.73_m2} (ref >59)

## 2022-07-01 LAB — LIPID PANEL W/O CHOL/HDL RATIO
Cholesterol, Total: 107 mg/dL (ref 100–199)
HDL: 40 mg/dL (ref >39)
LDL Chol Calc (NIH): 49 mg/dL (ref 0–99)
Triglycerides: 92 mg/dL (ref 0–149)
VLDL Cholesterol Cal: 18 mg/dL (ref 5–40)

## 2022-07-01 LAB — HIV ANTIBODY (ROUTINE TESTING W REFLEX): HIV Screen 4th Generation wRfx: NONREACTIVE

## 2022-07-01 LAB — HGB A1C W/O EAG: Hgb A1c MFr Bld: 10 % — ABNORMAL HIGH (ref 4.8–5.6)

## 2022-07-01 LAB — TSH: TSH: 1.88 u[IU]/mL (ref 0.450–4.500)

## 2022-07-07 ENCOUNTER — Ambulatory Visit (INDEPENDENT_AMBULATORY_CARE_PROVIDER_SITE_OTHER): Payer: Medicare Other | Admitting: Nurse Practitioner

## 2022-07-07 ENCOUNTER — Encounter: Payer: Self-pay | Admitting: Nurse Practitioner

## 2022-07-07 VITALS — BP 132/72 | HR 105 | Ht 64.0 in | Wt 206.0 lb

## 2022-07-07 DIAGNOSIS — J449 Chronic obstructive pulmonary disease, unspecified: Secondary | ICD-10-CM | POA: Diagnosis not present

## 2022-07-07 DIAGNOSIS — Z720 Tobacco use: Secondary | ICD-10-CM

## 2022-07-07 DIAGNOSIS — H9193 Unspecified hearing loss, bilateral: Secondary | ICD-10-CM

## 2022-07-07 DIAGNOSIS — E1165 Type 2 diabetes mellitus with hyperglycemia: Secondary | ICD-10-CM

## 2022-07-07 DIAGNOSIS — Z794 Long term (current) use of insulin: Secondary | ICD-10-CM

## 2022-07-07 NOTE — Progress Notes (Signed)
Established Patient Office Visit  Subjective:  Patient ID: Traci Pena, female    DOB: [birth year]  Age: 52 y.o. MRN: [id number]  Chief Complaint  Patient presents with   Follow-up    6 month follow up, review lab results.    6 month follow up and review of recent fasting labs, A1c is at 10%. LDL is at goal.  Patient continues to follow up with specialties:  Endocrinology, Psychiatry and Pulmonology.    No other concerns at this time.   Past Medical History:  Diagnosis Date   Chronic constipation    Depression    Diabetes mellitus without complication ([place])    Gastritis    GERD (gastroesophageal reflux disease)    Hypertension     Past Surgical History:  Procedure Laterality Date   COLONOSCOPY N/A 09/06/2014   Procedure: COLONOSCOPY;  Surgeon: Josefine Class, MD;  Location: [facility] ENDOSCOPY;  Service: Endoscopy;  Laterality: N/A;    Social History   Socioeconomic History   Marital status: Single    Spouse name: Not on file   Number of children: Not on file   Years of education: Not on file   Highest education level: Not on file  Occupational History   Not on file  Tobacco Use   Smoking status: Every Day    Packs/day: 4.00    Years: 3.00    Additional pack years: 0.00    Total pack years: 12.00    Types: Cigarettes   Smokeless tobacco: Never  Vaping Use   Vaping Use: Never used  Substance and Sexual Activity   Alcohol use: No    Alcohol/week: 0.0 standard drinks of alcohol   Drug use: No   Sexual activity: Never  Other Topics Concern   Not on file  Social History Narrative   Not on file   Social Determinants of Health   Financial Resource Strain: Not on file  Food Insecurity: Not on file  Transportation Needs: Not on file  Physical Activity: Not on file  Stress: Not on file  Social Connections: Not on file  Intimate Partner Violence: Not on file    Family History  Problem Relation Age of Onset   Breast cancer Neg Hx     No Known  Allergies  Review of Systems  Constitutional: Negative.   HENT:  Positive for hearing loss.   Eyes: Negative.   Respiratory: Negative.    Cardiovascular: Negative.   Gastrointestinal: Negative.   Genitourinary: Negative.   Musculoskeletal: Negative.   Skin: Negative.   Neurological: Negative.   Endo/Heme/Allergies: Negative.   Psychiatric/Behavioral: Negative.         Objective:   BP 132/72   Pulse (!) 105   Ht 5\' 4"  (1.626 m)   Wt 206 lb (93.4 kg)   SpO2 98%   BMI 35.36 kg/m   Vitals:   07/07/22 0956  BP: 132/72  Pulse: (!) 105  Height: 5\' 4"  (1.626 m)  Weight: 206 lb (93.4 kg)  SpO2: 98%  BMI (Calculated): 35.34    Physical Exam Vitals reviewed.  Constitutional:      Appearance: Normal appearance.  HENT:     Head: Normocephalic.     Nose: Nose normal.     Mouth/Throat:     Mouth: Mucous membranes are moist.  Eyes:     Pupils: Pupils are equal, round, and reactive to light.  Cardiovascular:     Rate and Rhythm: Normal rate and regular rhythm.  Pulmonary:  Effort: Pulmonary effort is normal.     Breath sounds: Normal breath sounds.  Abdominal:     General: Bowel sounds are normal.     Palpations: Abdomen is soft.  Musculoskeletal:     Cervical back: Normal range of motion and neck supple.  Skin:    General: Skin is warm and dry.  Neurological:     Mental Status: She is alert and oriented to person, place, and time.  Psychiatric:        Mood and Affect: Mood normal.        Behavior: Behavior normal.      No results found for any visits on 07/07/22.  Recent Results (from the past 2160 hour(s))  CBC with Differential/Platelet     Status: Abnormal   Collection Time: 06/30/22 10:24 AM  Result Value Ref Range   WBC 10.3 3.4 - 10.8 x10E3/uL   RBC 5.23 3.77 - 5.28 x10E6/uL   Hemoglobin 12.4 11.1 - 15.9 g/dL   Hematocrit 39.6 34.0 - 46.6 %   MCV 76 (L) 79 - 97 fL   MCH 23.7 (L) 26.6 - 33.0 pg   MCHC 31.3 (L) 31.5 - 35.7 g/dL   RDW 14.7  11.7 - 15.4 %   Platelets 492 (H) 150 - 450 x10E3/uL   Neutrophils 55 Not Estab. %   Lymphs 37 Not Estab. %   Monocytes 5 Not Estab. %   Eos 2 Not Estab. %   Basos 1 Not Estab. %   Neutrophils Absolute 5.7 1.4 - 7.0 x10E3/uL   Lymphocytes Absolute 3.8 (H) 0.7 - 3.1 x10E3/uL   Monocytes Absolute 0.5 0.1 - 0.9 x10E3/uL   EOS (ABSOLUTE) 0.2 0.0 - 0.4 x10E3/uL   Basophils Absolute 0.1 0.0 - 0.2 x10E3/uL   Immature Granulocytes 0 Not Estab. %   Immature Grans (Abs) 0.0 0.0 - 0.1 x10E3/uL  Comprehensive metabolic panel     Status: Abnormal   Collection Time: 06/30/22 10:24 AM  Result Value Ref Range   Glucose 40 (L) 70 - 99 mg/dL   BUN 9 6 - 24 mg/dL   Creatinine, Ser 0.87 0.57 - 1.00 mg/dL   eGFR 81 >59 mL/min/1.73   BUN/Creatinine Ratio 10 9 - 23   Sodium 143 134 - 144 mmol/L   Potassium 4.6 3.5 - 5.2 mmol/L   Chloride 105 96 - 106 mmol/L   CO2 21 20 - 29 mmol/L   Calcium 10.3 (H) 8.7 - 10.2 mg/dL   Total Protein 7.1 6.0 - 8.5 g/dL   Albumin 4.2 3.9 - 4.9 g/dL   Globulin, Total 2.9 1.5 - 4.5 g/dL   Albumin/Globulin Ratio 1.4 1.2 - 2.2   Bilirubin Total 0.2 0.0 - 1.2 mg/dL   Alkaline Phosphatase 106 44 - 121 IU/L   AST 15 0 - 40 IU/L   ALT 19 0 - 32 IU/L  Lipid Panel w/o Chol/HDL Ratio     Status: None   Collection Time: 06/30/22 10:24 AM  Result Value Ref Range   Cholesterol, Total 107 100 - 199 mg/dL   Triglycerides 92 0 - 149 mg/dL   HDL 40 >39 mg/dL   VLDL Cholesterol Cal 18 5 - 40 mg/dL   LDL Chol Calc (NIH) 49 0 - 99 mg/dL  Hgb A1c w/o eAG     Status: Abnormal   Collection Time: 06/30/22 10:24 AM  Result Value Ref Range   Hgb A1c MFr Bld 10.0 (H) 4.8 - 5.6 %    Comment:  Prediabetes: 5.7 - 6.4          Diabetes: >6.4          Glycemic control for adults with diabetes: <7.0   TSH     Status: None   Collection Time: 06/30/22 10:24 AM  Result Value Ref Range   TSH 1.880 0.450 - 4.500 uIU/mL  HIV Antibody (routine testing w rflx)     Status: None    Collection Time: 06/30/22 10:24 AM  Result Value Ref Range   HIV Screen 4th Generation wRfx Non Reactive Non Reactive    Comment: HIV Negative HIV-1/HIV-2 antibodies and HIV-1 p24 antigen were NOT detected. There is no laboratory evidence of HIV infection.       Assessment & Plan:   Problem List Items Addressed This Visit       Respiratory   Chronic obstructive pulmonary disease ([place])     Endocrine   Uncontrolled type 2 diabetes mellitus with hyperglycemia, with long-term current use of insulin (HCC)     Nervous and Auditory   Bilateral deafness     Other   Current tobacco use - Primary    Return in about 6 months (around 01/07/2023).   Total time spent: 35 minutes  Evern Bio, NP  07/07/2022

## 2022-07-07 NOTE — Patient Instructions (Signed)
1) Follow up with specialties 2) Follow up appt in 6 months, fasting labs prior

## 2022-07-21 ENCOUNTER — Other Ambulatory Visit: Payer: Self-pay | Admitting: Nurse Practitioner

## 2022-08-06 ENCOUNTER — Encounter: Payer: Medicare Other | Admitting: Family

## 2022-08-06 DIAGNOSIS — Z309 Encounter for contraceptive management, unspecified: Secondary | ICD-10-CM

## 2022-08-06 MED ORDER — MEDROXYPROGESTERONE ACETATE 150 MG/ML IM SUSY: PREFILLED_SYRINGE | Freq: Once | INTRAMUSCULAR | Status: DC

## 2022-08-11 NOTE — Progress Notes (Signed)
This encounter was created in error - please disregard.

## 2022-08-16 ENCOUNTER — Ambulatory Visit (INDEPENDENT_AMBULATORY_CARE_PROVIDER_SITE_OTHER): Payer: Medicare Other | Admitting: Nurse Practitioner

## 2022-08-16 DIAGNOSIS — Z3042 Encounter for surveillance of injectable contraceptive: Secondary | ICD-10-CM | POA: Diagnosis not present

## 2022-08-16 DIAGNOSIS — Z30013 Encounter for initial prescription of injectable contraceptive: Secondary | ICD-10-CM

## 2022-08-16 MED ORDER — MEDROXYPROGESTERONE ACETATE 150 MG/ML IM SUSY
1.0000 mL | PREFILLED_SYRINGE | Freq: Once | INTRAMUSCULAR | Status: AC
  Administered 2022-08-16: 150 mg via INTRAMUSCULAR

## 2022-08-19 ENCOUNTER — Other Ambulatory Visit: Payer: Self-pay | Admitting: Nurse Practitioner

## 2022-08-30 ENCOUNTER — Other Ambulatory Visit: Payer: Self-pay | Admitting: Nurse Practitioner

## 2022-08-30 ENCOUNTER — Telehealth: Payer: Self-pay | Admitting: Nurse Practitioner

## 2022-08-30 DIAGNOSIS — B353 Tinea pedis: Secondary | ICD-10-CM

## 2022-08-30 NOTE — Telephone Encounter (Signed)
Bluford Kaufmann, an auditor with the state of [place], called and left VM stating she has questions regarding patients medications and some of her care.   Call back # [phone]

## 2022-09-21 NOTE — Telephone Encounter (Signed)
Awaiting call back LM

## 2022-10-13 ENCOUNTER — Other Ambulatory Visit: Payer: Self-pay | Admitting: Nurse Practitioner

## 2022-10-22 ENCOUNTER — Other Ambulatory Visit: Payer: Self-pay | Admitting: Internal Medicine

## 2022-10-22 DIAGNOSIS — B353 Tinea pedis: Secondary | ICD-10-CM

## 2022-10-27 ENCOUNTER — Other Ambulatory Visit: Payer: Self-pay | Admitting: Nurse Practitioner

## 2022-11-16 ENCOUNTER — Ambulatory Visit: Payer: Medicare Other | Admitting: Family

## 2022-11-16 ENCOUNTER — Encounter: Payer: Self-pay | Admitting: Family

## 2022-11-16 ENCOUNTER — Ambulatory Visit: Payer: Medicare Other

## 2022-11-16 DIAGNOSIS — Z3042 Encounter for surveillance of injectable contraceptive: Secondary | ICD-10-CM

## 2022-11-16 MED ORDER — MEDROXYPROGESTERONE ACETATE 104 MG/0.65ML ~~LOC~~ SUSY: 104.0000 mg | PREFILLED_SYRINGE | Freq: Once | SUBCUTANEOUS | Status: DC

## 2022-11-16 NOTE — Progress Notes (Signed)
   Subjective   CHIEF COMPLAINT  Depo Shot     REASON FOR VISIT  Depo Shot Only       Objective   No results found for any visits on 11/16/22.  Assessment & Plan  1. Encounter for Depo-Provera contraception Depo-provera injection given in office today. Repeat in 3 months.   Total time spent: 5 minutes  Miki Kins, FNP 11/16/2022

## 2022-11-26 ENCOUNTER — Telehealth: Payer: Self-pay | Admitting: Nurse Practitioner

## 2022-11-26 NOTE — Telephone Encounter (Signed)
Beverly left message with answering service that she has paperwork that needs to be filled out and needs to know the date of the patient's last visit.

## 2022-12-06 ENCOUNTER — Other Ambulatory Visit (HOSPITAL_COMMUNITY): Payer: Self-pay | Admitting: Internal Medicine

## 2022-12-06 DIAGNOSIS — Z1231 Encounter for screening mammogram for malignant neoplasm of breast: Secondary | ICD-10-CM

## 2022-12-07 ENCOUNTER — Other Ambulatory Visit: Payer: Self-pay | Admitting: Nurse Practitioner

## 2022-12-07 MED ORDER — ASPIRIN 81 MG PO TBEC: 81.0000 mg | DELAYED_RELEASE_TABLET | Freq: Every day | ORAL | 2 refills | Status: AC

## 2022-12-07 MED ORDER — MONTELUKAST SODIUM 10 MG PO TABS: 10.0000 mg | ORAL_TABLET | Freq: Every day | ORAL | 1 refills | Status: DC

## 2022-12-07 MED ORDER — B COMPLEX VITAMINS PO CAPS: 1.0000 | ORAL_CAPSULE | Freq: Every day | ORAL | 1 refills | Status: DC

## 2022-12-07 MED ORDER — VITAMIN D-3 125 MCG (5000 UT) PO TABS
5000.0000 [IU] | ORAL_TABLET | Freq: Every day | ORAL | 5 refills | Status: DC
Start: 2022-12-07 — End: 2023-06-07

## 2022-12-07 MED ORDER — ONE-DAILY MULTI VITAMINS PO TABS: 1.0000 | ORAL_TABLET | Freq: Every day | ORAL | 1 refills | Status: DC

## 2022-12-08 ENCOUNTER — Ambulatory Visit (INDEPENDENT_AMBULATORY_CARE_PROVIDER_SITE_OTHER): Payer: Medicare Other | Admitting: Family

## 2022-12-08 VITALS — BP 110/80 | HR 107 | Ht 64.0 in | Wt 193.2 lb

## 2022-12-08 DIAGNOSIS — Z794 Long term (current) use of insulin: Secondary | ICD-10-CM

## 2022-12-08 DIAGNOSIS — F209 Schizophrenia, unspecified: Secondary | ICD-10-CM | POA: Diagnosis not present

## 2022-12-08 DIAGNOSIS — J449 Chronic obstructive pulmonary disease, unspecified: Secondary | ICD-10-CM

## 2022-12-08 DIAGNOSIS — E1165 Type 2 diabetes mellitus with hyperglycemia: Secondary | ICD-10-CM | POA: Diagnosis not present

## 2022-12-08 LAB — POCT CBG (FASTING - GLUCOSE)-MANUAL ENTRY: Glucose Fasting, POC: 44 mg/dL — AB (ref 70–99)

## 2022-12-12 ENCOUNTER — Encounter: Payer: Self-pay | Admitting: Family

## 2022-12-12 NOTE — Progress Notes (Signed)
Established Patient Office Visit  Subjective:  Patient ID: Traci Pena, female    DOB: 18-Sep-1970  Age: 52 y.o. MRN: [id number]  Chief Complaint  Patient presents with   Follow-up    Personal care form     Patient is here today for her follow up.  She has been feeling well since last appointment.   She does not have additional concerns to discuss today.  Labs are not due today. She needs refills.   I have reviewed her active problem list, medication list, allergies, notes from last encounter, lab results for her appointment today.      No other concerns at this time.   Past Medical History:  Diagnosis Date   Chronic constipation    Cough 10/23/2013   Depression    Diabetes mellitus without complication (HCC)    Gastritis    GERD (gastroesophageal reflux disease)    Hypertension     Past Surgical History:  Procedure Laterality Date   COLONOSCOPY N/A 09/06/2014   Procedure: COLONOSCOPY;  Surgeon: Elnita Maxwell, MD;  Location: [facility] ENDOSCOPY;  Service: Endoscopy;  Laterality: N/A;    Social History   Socioeconomic History   Marital status: Single    Spouse name: Not on file   Number of children: Not on file   Years of education: Not on file   Highest education level: Not on file  Occupational History   Not on file  Tobacco Use   Smoking status: Every Day    Current packs/day: 4.00    Average packs/day: 4.0 packs/day for 3.0 years (12.0 ttl pk-yrs)    Types: Cigarettes   Smokeless tobacco: Never  Vaping Use   Vaping status: Never Used  Substance and Sexual Activity   Alcohol use: No    Alcohol/week: 0.0 standard drinks of alcohol   Drug use: No   Sexual activity: Never  Other Topics Concern   Not on file  Social History Narrative   Not on file   Social Determinants of Health   Financial Resource Strain: Low Risk  (12/08/2022)   Received [facility]   Overall Financial Resource Strain (CARDIA)    Difficulty of Paying  Living Expenses: Not hard at all  Food Insecurity: No Food Insecurity (12/08/2022)   Received from [facility]   Hunger Vital Sign    Worried About Running Out of Food in the Last Year: Never true    Ran Out of Food in the Last Year: Never true  Transportation Needs: No Transportation Needs (12/08/2022)   Received from [facility] - Transportation    In the past 12 months, has lack of transportation kept you from medical appointments or from getting medications?: No    Lack of Transportation (Non-Medical): No  Physical Activity: Not on file  Stress: Not on file  Social Connections: Not on file  Intimate Partner Violence: Not on file    Family History  Problem Relation Age of Onset   Breast cancer Neg Hx     No Known Allergies  Review of Systems  HENT:  Positive for hearing loss.   All other systems reviewed and are negative.      Objective:   BP 110/80   Pulse (!) 107   Ht 5\' 4"  (1.626 m)   Wt 193 lb 3.2 oz (87.6 kg)   SpO2 99%   BMI 33.16 kg/m   Vitals:   12/08/22 1418  BP: 110/80  Pulse: (!) 107  Height: 5\' 4"  (1.626 m)  Weight: 193 lb 3.2 oz (87.6 kg)  SpO2: 99%  BMI (Calculated): 33.15    Physical Exam Vitals and nursing note reviewed.  Constitutional:      Appearance: Normal appearance. She is obese.  HENT:     Head: Normocephalic.     Right Ear: Decreased hearing noted.     Left Ear: Decreased hearing noted.  Eyes:     Extraocular Movements: Extraocular movements intact.     Conjunctiva/sclera: Conjunctivae normal.     Pupils: Pupils are equal, round, and reactive to light.  Cardiovascular:     Rate and Rhythm: Normal rate.  Pulmonary:     Effort: Pulmonary effort is normal.  Neurological:     Mental Status: She is alert.      Results for orders placed or performed in visit on 12/08/22  POCT CBG (Fasting - Glucose)  Result Value Ref Range   Glucose Fasting, POC 44 (A) 70 - 99 mg/dL     Recent Results (from the past 2160 hour(s))  POCT CBG (Fasting - Glucose)     Status: Abnormal   Collection Time: 12/08/22  2:22 PM  Result Value Ref Range   Glucose Fasting, POC 44 (A) 70 - 99 mg/dL       Assessment & Plan:   Problem List Items Addressed This Visit       Active Problems   Long term current use of insulin (HCC)    Checking labs today. Will call pt. With results  Continue current diabetes POC, as patient has been well controlled on current regimen.  Will adjust meds if needed based on labs.       Uncontrolled type 2 diabetes mellitus with hyperglycemia, with long-term current use of insulin (HCC) - Primary    Checking labs today. Will call pt. With results  Continue current diabetes POC, as patient has been well controlled on current regimen.  Will adjust meds if needed based on labs.       Relevant Orders   POCT CBG (Fasting - Glucose) (Completed)   Chronic obstructive pulmonary disease (HCC)    Patient stable.  Well controlled with current therapy.   Continue current meds.       Schizophrenia [employer])    Patient is seen by psychiatry, who manage this condition.  She is well controlled with current therapy.   Will defer to them for further changes to plan of care.        Return in about 3 months (around 03/10/2023).   Total time spent: 30 minutes  Miki Kins, FNP  12/08/2022   This document may have been prepared by [facility] Voice Recognition software and as such may include unintentional dictation errors.

## 2022-12-12 NOTE — Assessment & Plan Note (Signed)
Patient is seen by psychiatry, who manage this condition.  She is well controlled with current therapy.   Will defer to them for further changes to plan of care.

## 2022-12-12 NOTE — Assessment & Plan Note (Signed)
Checking labs today. Will call pt. With results  Continue current diabetes POC, as patient has been well controlled on current regimen.  Will adjust meds if needed based on labs.  

## 2022-12-12 NOTE — Assessment & Plan Note (Signed)
Patient stable.  Well controlled with current therapy.   Continue current meds.  

## 2023-01-07 ENCOUNTER — Ambulatory Visit: Payer: Medicare Other | Admitting: Cardiology

## 2023-01-10 ENCOUNTER — Other Ambulatory Visit: Payer: Self-pay | Admitting: Cardiology

## 2023-01-10 ENCOUNTER — Other Ambulatory Visit: Payer: Self-pay

## 2023-01-10 ENCOUNTER — Telehealth: Payer: Self-pay | Admitting: Cardiology

## 2023-01-10 DIAGNOSIS — D500 Iron deficiency anemia secondary to blood loss (chronic): Secondary | ICD-10-CM

## 2023-01-10 NOTE — Telephone Encounter (Signed)
Pharm called in regards to getting a refill for some medication, states they have been trying to get this refill for a month

## 2023-01-14 ENCOUNTER — Other Ambulatory Visit: Payer: Self-pay | Admitting: Cardiology

## 2023-01-14 ENCOUNTER — Ambulatory Visit (HOSPITAL_COMMUNITY)
Admission: RE | Admit: 2023-01-14 | Discharge: 2023-01-14 | Disposition: A | Discharge status: Home / Self Care | Payer: Medicare Other | Source: Ambulatory Visit | Attending: Internal Medicine | Admitting: Internal Medicine

## 2023-01-14 DIAGNOSIS — Z1231 Encounter for screening mammogram for malignant neoplasm of breast: Secondary | ICD-10-CM | POA: Diagnosis present

## 2023-01-26 ENCOUNTER — Ambulatory Visit: Payer: Medicare Other | Admitting: Cardiology

## 2023-01-26 ENCOUNTER — Encounter: Payer: Self-pay | Admitting: Cardiology

## 2023-01-26 VITALS — BP 130/75 | HR 112 | Ht 64.0 in | Wt 191.0 lb

## 2023-01-26 DIAGNOSIS — Z794 Long term (current) use of insulin: Secondary | ICD-10-CM

## 2023-01-26 DIAGNOSIS — E1165 Type 2 diabetes mellitus with hyperglycemia: Secondary | ICD-10-CM | POA: Diagnosis not present

## 2023-01-26 DIAGNOSIS — Z720 Tobacco use: Secondary | ICD-10-CM | POA: Diagnosis not present

## 2023-01-26 DIAGNOSIS — F17200 Nicotine dependence, unspecified, uncomplicated: Secondary | ICD-10-CM

## 2023-01-26 DIAGNOSIS — I10 Essential (primary) hypertension: Secondary | ICD-10-CM

## 2023-01-26 NOTE — Progress Notes (Signed)
Established Patient Office Visit  Subjective:  Patient ID: Traci Pena, female    DOB: [birth year]  Age: 52 y.o. MRN: [id number]  Chief Complaint  Patient presents with   Follow-up    Pt is ready to quit smoking    Patient in office for regular follow up. ASL interpretor in the room. Patient reports feeling well. No complaints today. Patient attempting to quit smoking. Discussed nicotine patches, patient declines at this time.  Blood sugars improved. Patient making attempts to drink less soda, more water. No changes today. Continue to decrease cigarette use daily.     No other concerns at this time.   Past Medical History:  Diagnosis Date   Chronic constipation    Cough 10/23/2013   Depression    Diabetes mellitus without complication (HCC)    Gastritis    GERD (gastroesophageal reflux disease)    Hypertension     Past Surgical History:  Procedure Laterality Date   COLONOSCOPY N/A 09/06/2014   Procedure: COLONOSCOPY;  Surgeon: Elnita Maxwell, MD;  Location: [facility] ENDOSCOPY;  Service: Endoscopy;  Laterality: N/A;    Social History   Socioeconomic History   Marital status: Single    Spouse name: Not on file   Number of children: Not on file   Years of education: Not on file   Highest education level: Not on file  Occupational History   Not on file  Tobacco Use   Smoking status: Every Day    Current packs/day: 4.00    Average packs/day: 4.0 packs/day for 3.0 years (12.0 ttl pk-yrs)    Types: Cigarettes   Smokeless tobacco: Never  Vaping Use   Vaping status: Never Used  Substance and Sexual Activity   Alcohol use: No    Alcohol/week: 0.0 standard drinks of alcohol   Drug use: No   Sexual activity: Never  Other Topics Concern   Not on file  Social History Narrative   Not on file   Social Determinants of Health   Financial Resource Strain: Low Risk  (12/08/2022)   Received [facility]   Overall Financial Resource Strain  (CARDIA)    Difficulty of Paying Living Expenses: Not hard at all  Food Insecurity: No Food Insecurity (12/08/2022)   Received from [facility]   Hunger Vital Sign    Worried About Running Out of Food in the Last Year: Never true    Ran Out of Food in the Last Year: Never true  Transportation Needs: No Transportation Needs (12/08/2022)   Received from [facility] - Transportation    In the past 12 months, has lack of transportation kept you from medical appointments or from getting medications?: No    Lack of Transportation (Non-Medical): No  Physical Activity: Not on file  Stress: Not on file  Social Connections: Not on file  Intimate Partner Violence: Not on file    Family History  Problem Relation Age of Onset   Breast cancer Neg Hx     No Known Allergies  Review of Systems  Constitutional: Negative.   HENT: Negative.    Eyes: Negative.   Respiratory: Negative.  Negative for shortness of breath.   Cardiovascular: Negative.  Negative for chest pain.  Gastrointestinal: Negative.  Negative for abdominal pain, constipation and diarrhea.  Genitourinary: Negative.   Musculoskeletal:  Negative for joint pain and myalgias.  Skin: Negative.   Neurological: Negative.  Negative for dizziness and headaches.  Endo/Heme/Allergies:  Negative.   All other systems reviewed and are negative.      Objective:   BP 130/75   Pulse (!) 112   Ht 5\' 4"  (1.626 m)   Wt 191 lb (86.6 kg)   SpO2 97%   BMI 32.79 kg/m   Vitals:   01/26/23 1117  BP: 130/75  Pulse: (!) 112  Height: 5\' 4"  (1.626 m)  Weight: 191 lb (86.6 kg)  SpO2: 97%  BMI (Calculated): 32.77    Physical Exam Vitals and nursing note reviewed.  Constitutional:      Appearance: Normal appearance. She is normal weight.  HENT:     Head: Normocephalic and atraumatic.     Nose: Nose normal.     Mouth/Throat:     Mouth: Mucous membranes are moist.  Eyes:     Extraocular  Movements: Extraocular movements intact.     Conjunctiva/sclera: Conjunctivae normal.     Pupils: Pupils are equal, round, and reactive to light.  Cardiovascular:     Rate and Rhythm: Normal rate and regular rhythm.     Pulses: Normal pulses.     Heart sounds: Normal heart sounds.  Pulmonary:     Effort: Pulmonary effort is normal.     Breath sounds: Normal breath sounds.  Abdominal:     General: Abdomen is flat. Bowel sounds are normal.     Palpations: Abdomen is soft.  Musculoskeletal:        General: Normal range of motion.     Cervical back: Normal range of motion.  Skin:    General: Skin is warm and dry.  Neurological:     General: No focal deficit present.     Mental Status: She is alert and oriented to person, place, and time.  Psychiatric:        Mood and Affect: Mood normal.        Behavior: Behavior normal.        Thought Content: Thought content normal.        Judgment: Judgment normal.      No results found for any visits on 01/26/23.  Recent Results (from the past 2160 hour(s))  POCT CBG (Fasting - Glucose)     Status: Abnormal   Collection Time: 12/08/22  2:22 PM  Result Value Ref Range   Glucose Fasting, POC 44 (A) 70 - 99 mg/dL      Assessment & Plan:  Keep already schedule appointment end of November.   Problem List Items Addressed This Visit   None   Return in about 6 weeks (around 03/09/2023) for with Marchelle Folks, needs 30 minute appts.   Total time spent: 25 minutes  [employer], NP  01/26/2023   This document may have been prepared by Dragon Voice Recognition software and as such may include unintentional dictation errors.

## 2023-01-31 ENCOUNTER — Other Ambulatory Visit: Payer: Self-pay | Admitting: Cardiology

## 2023-01-31 DIAGNOSIS — D500 Iron deficiency anemia secondary to blood loss (chronic): Secondary | ICD-10-CM

## 2023-02-04 ENCOUNTER — Other Ambulatory Visit: Payer: Self-pay | Admitting: Internal Medicine

## 2023-02-04 DIAGNOSIS — B353 Tinea pedis: Secondary | ICD-10-CM

## 2023-02-07 ENCOUNTER — Ambulatory Visit: Payer: Medicare Other | Admitting: Family

## 2023-02-16 ENCOUNTER — Ambulatory Visit: Payer: Medicare Other

## 2023-02-16 ENCOUNTER — Ambulatory Visit: Payer: Medicare Other | Admitting: Cardiology

## 2023-02-16 DIAGNOSIS — Z3042 Encounter for surveillance of injectable contraceptive: Secondary | ICD-10-CM

## 2023-02-16 LAB — POCT URINE PREGNANCY: Preg Test, Ur: NEGATIVE — AB

## 2023-02-16 MED ORDER — MEDROXYPROGESTERONE ACETATE 150 MG/ML IM SUSY: PREFILLED_SYRINGE | INTRAMUSCULAR | Status: DC

## 2023-03-07 ENCOUNTER — Ambulatory Visit: Payer: Medicare Other | Admitting: Family

## 2023-04-15 ENCOUNTER — Other Ambulatory Visit: Payer: Self-pay | Admitting: Cardiology

## 2023-05-09 ENCOUNTER — Other Ambulatory Visit: Payer: Self-pay | Admitting: Cardiology

## 2023-05-09 MED ORDER — MEDROXYPROGESTERONE ACETATE 150 MG/ML IM SUSP: 150.0000 mg | INTRAMUSCULAR | 3 refills | Status: DC

## 2023-05-19 ENCOUNTER — Telehealth: Payer: Self-pay

## 2023-05-19 ENCOUNTER — Ambulatory Visit: Payer: Medicare Other

## 2023-05-19 NOTE — Telephone Encounter (Signed)
 Beverly informed that we are no longer able to do depo shots for the patient as the insurance is not covering the injections, female CG was informed of this months ago, this was during a nurse visit that they forgot the medication at the pharamcy, he must of forgot or not informed the facility bc pt was scheduled again for injection, beverly was informed that we could no longer do this and the nurse at the facility should be able to give injections and if not to let us  know so we can write a d/c order for the DEPO

## 2023-05-23 ENCOUNTER — Telehealth: Payer: Self-pay

## 2023-05-23 NOTE — Telephone Encounter (Signed)
 Patient CG called stating that their head RN informed her she would not be or not able to give the patient the depo shot injection, nor the pharmacy nor the health dept.  Alvy Baar who Is the group home owner asked if you can D/C the depo bc injection and send her in a bill instead?

## 2023-06-07 ENCOUNTER — Other Ambulatory Visit: Payer: Self-pay

## 2023-06-08 MED ORDER — VITAMIN D-3 125 MCG (5000 UT) PO TABS: 5000.0000 [IU] | ORAL_TABLET | Freq: Every day | ORAL | 5 refills | Status: DC

## 2023-06-08 MED ORDER — B COMPLEX VITAMINS PO CAPS: 1.0000 | ORAL_CAPSULE | Freq: Every day | ORAL | 1 refills | Status: DC

## 2023-06-08 MED ORDER — ONE-DAILY MULTI VITAMINS PO TABS: 1.0000 | ORAL_TABLET | Freq: Every day | ORAL | 1 refills | Status: DC

## 2023-06-15 ENCOUNTER — Other Ambulatory Visit: Payer: Self-pay | Admitting: Family

## 2023-06-21 ENCOUNTER — Other Ambulatory Visit: Payer: Self-pay | Admitting: Family

## 2023-06-21 DIAGNOSIS — B353 Tinea pedis: Secondary | ICD-10-CM

## 2023-06-29 ENCOUNTER — Telehealth: Payer: Self-pay | Admitting: Cardiology

## 2023-06-29 NOTE — Telephone Encounter (Signed)
 Traci Pena called to see what to do since we can no longer give Depo shots to Medicaid patients. The pharmacy said they cannot give it and the health dept said they cannot either. Traci Pena wants to know if we can d/c the Depo shots and put her on a pill. Please advise on what to do.

## 2023-07-01 ENCOUNTER — Other Ambulatory Visit: Payer: Self-pay | Admitting: Cardiology

## 2023-07-04 ENCOUNTER — Other Ambulatory Visit: Payer: Self-pay | Admitting: Cardiology

## 2023-07-04 MED ORDER — NORGESTIMATE-ETH ESTRADIOL 0.18/0.215/0.25 MG-25 MCG PO TABS
1.0000 | ORAL_TABLET | Freq: Every day | ORAL | 3 refills | Status: DC
Start: 2023-07-04 — End: 2023-07-14

## 2023-07-14 ENCOUNTER — Other Ambulatory Visit: Payer: Self-pay

## 2023-07-14 MED ORDER — NORGESTIMATE-ETH ESTRADIOL 0.18/0.215/0.25 MG-25 MCG PO TABS: 1.0000 | ORAL_TABLET | Freq: Every day | ORAL | 3 refills | Status: AC

## 2023-07-14 NOTE — Telephone Encounter (Signed)
 Sent fax

## 2023-07-18 ENCOUNTER — Other Ambulatory Visit: Payer: Self-pay | Admitting: Family

## 2023-08-04 ENCOUNTER — Other Ambulatory Visit: Payer: Self-pay | Admitting: Cardiology

## 2023-08-31 ENCOUNTER — Other Ambulatory Visit: Payer: Self-pay | Admitting: Cardiology

## 2023-09-28 ENCOUNTER — Ambulatory Visit: Admitting: Cardiology

## 2023-10-03 ENCOUNTER — Ambulatory Visit: Admitting: Cardiology

## 2023-10-04 ENCOUNTER — Ambulatory Visit (INDEPENDENT_AMBULATORY_CARE_PROVIDER_SITE_OTHER): Admitting: Cardiology

## 2023-10-04 ENCOUNTER — Encounter: Payer: Self-pay | Admitting: Cardiology

## 2023-10-04 VITALS — BP 105/70 | HR 107 | Ht 64.0 in | Wt 181.6 lb

## 2023-10-04 DIAGNOSIS — E1165 Type 2 diabetes mellitus with hyperglycemia: Secondary | ICD-10-CM | POA: Diagnosis not present

## 2023-10-04 DIAGNOSIS — I10 Essential (primary) hypertension: Secondary | ICD-10-CM | POA: Diagnosis not present

## 2023-10-04 DIAGNOSIS — Z720 Tobacco use: Secondary | ICD-10-CM | POA: Diagnosis not present

## 2023-10-04 DIAGNOSIS — Z794 Long term (current) use of insulin: Secondary | ICD-10-CM

## 2023-10-04 DIAGNOSIS — F17200 Nicotine dependence, unspecified, uncomplicated: Secondary | ICD-10-CM | POA: Diagnosis not present

## 2023-10-04 NOTE — Progress Notes (Signed)
 Established Patient Office Visit  Subjective:  Patient ID: Traci Pena, female    DOB: [birth year]  Age: 53 y.o. MRN: [id number]  Chief Complaint  Patient presents with   Follow-up    Experiencing vomiting and dizziness     Patient in office for a regular follow up. No acute complaints today.  Endocrinology manages patient's DM.  Seeing pulmonary for COPD. Residential facility to fax an updated medication list to verify current medications. Heart rate elevated today, unknown if patient is taking her atenolol.  Continue same medications.    No other concerns at this time.   Past Medical History:  Diagnosis Date   Chronic constipation    Cough 10/23/2013   Depression    Diabetes mellitus without complication (HCC)    Gastritis    GERD (gastroesophageal reflux disease)    Hypertension     Past Surgical History:  Procedure Laterality Date   COLONOSCOPY N/A 09/06/2014   Procedure: COLONOSCOPY;  Surgeon: Donnice Vaughn Manes, MD;  Location: [facility] ENDOSCOPY;  Service: Endoscopy;  Laterality: N/A;    Social History   Socioeconomic History   Marital status: Single    Spouse name: Not on file   Number of children: Not on file   Years of education: Not on file   Highest education level: Not on file  Occupational History   Not on file  Tobacco Use   Smoking status: Every Day    Current packs/day: 4.00    Average packs/day: 4.0 packs/day for 3.0 years (12.0 ttl pk-yrs)    Types: Cigarettes   Smokeless tobacco: Never  Vaping Use   Vaping status: Never Used  Substance and Sexual Activity   Alcohol use: No    Alcohol/week: 0.0 standard drinks of alcohol   Drug use: No   Sexual activity: Never  Other Topics Concern   Not on file  Social History Narrative   Not on file   Social Drivers of Health   Financial Resource Strain: Low Risk  (12/08/2022)   Received from [facility]   Overall Financial Resource Strain (CARDIA)    Difficulty of Paying  Living Expenses: Not hard at all  Food Insecurity: No Food Insecurity (12/08/2022)   Received from [facility]   Hunger Vital Sign    Within the past 12 months, you worried that your food would run out before you got the money to buy more.: Never true    Within the past 12 months, the food you bought just didn't last and you didn't have money to get more.: Never true  Transportation Needs: No Transportation Needs (12/08/2022)   Received from [facility] - Transportation    In the past 12 months, has lack of transportation kept you from medical appointments or from getting medications?: No    Lack of Transportation (Non-Medical): No  Physical Activity: Not on file  Stress: Not on file  Social Connections: Not on file  Intimate Partner Violence: Not on file    Family History  Problem Relation Age of Onset   Breast cancer Neg Hx     No Known Allergies  Outpatient Medications Prior to Visit  Medication Sig   albuterol  (PROVENTIL  HFA;VENTOLIN  HFA) 108 (90 BASE) MCG/ACT inhaler Inhale 2 puffs into the lungs every 6 (six) hours as needed for wheezing or shortness of breath.   aspirin  EC 81 MG tablet Take 1 tablet (81 mg total) by mouth daily. Swallow whole.  atenolol (TENORMIN) 25 MG tablet Take 25 mg by mouth daily.    b complex vitamins capsule Take 1 capsule by mouth daily.   benztropine (COGENTIN) 1 MG tablet TAKE 1 TABLET BY MOUTH ONCE DAILY.   budesonide-formoterol (SYMBICORT) 160-4.5 MCG/ACT inhaler Inhale 2 puffs into the lungs 2 (two) times daily.   cetirizine (ZYRTEC) 10 MG tablet Take 10 mg by mouth daily.   Cholecalciferol (VITAMIN D -3) 125 MCG (5000 UT) TABS Take 5,000 Units by mouth daily.   clotrimazole  (LOTRIMIN ) 1 % cream APPLY TOPICALLY TO AFFECTED AREA(S) TWICE DAILY.   cloZAPine (CLOZARIL) 100 MG tablet Take 100 mg by mouth in the morning and at bedtime. 1 tablet in the morning and 2 tablets before bed   GOODSENSE CLEARLAX  17 GM/SCOOP powder MIX 1 CAPFUL (17G) IN 8 OUNCES OF JUICE/WATER AND DRINK ONCE DAILY AS NEEDED FOR CONSTIPATION.   insulin aspart (NOVOLOG FLEXPEN) 100 UNIT/ML FlexPen Inject 8 Units into the skin 3 (three) times daily with meals. Per sliding scale   Insulin Glargine (TOUJEO SOLOSTAR) 300 UNIT/ML SOPN Inject 30 Units into the skin daily. Between 8pm-10pm   Insulin Pen Needle (FIFTY50 PEN NEEDLES) 31G X 8 MM MISC USE 4 TIMES DAILY   iron polysaccharides (NIFEREX) 150 MG capsule Take 150 mg by mouth daily.   lisinopril (ZESTRIL) 5 MG tablet TAKE 1 TABLET BY MOUTH ONCE DAILY.   lithium  carbonate (ESKALITH) 450 MG CR tablet Take 450 mg by mouth at bedtime.   metFORMIN (GLUCOPHAGE) 1000 MG tablet Take 1,000 mg by mouth 2 (two) times daily with a meal.   montelukast  (SINGULAIR ) 10 MG tablet TAKE (1) TABLET BY MOUTH ONCE DAILY.   Multiple Vitamin (MULTIVITAMIN) tablet Take 1 tablet by mouth daily.   Norgestimate -Eth Estradiol  (ORTHO TRI-CYCLEN LO) 0.18/0.215/0.25 MG-25 MCG TABS Take 1 tablet by mouth daily.   omeprazole (PRILOSEC) 40 MG capsule TAKE 1 CAPSULE BY MOUTH ONCE DAILY.   PAIN RELIEF EXTRA STRENGTH 500 MG tablet TAKE 1 TABLET BY MOUTH TWICE DAILY AS NEEDED FOR PAIN.   predniSONE  (DELTASONE ) 50 MG tablet One tablet a day, beginning on 11/24   sertraline (ZOLOFT) 100 MG tablet Take 100 mg by mouth daily.   simvastatin (ZOCOR) 40 MG tablet TAKE 1 TABLET BY MOUTH EVERY EVENING.   VICTOZA 18 MG/3ML SOPN Inject 18 mg into the skin daily.   [DISCONTINUED] lisinopril (PRINIVIL,ZESTRIL) 10 MG tablet Take 5 mg by mouth daily.   No facility-administered medications prior to visit.    Review of Systems  Constitutional: Negative.   HENT: Negative.    Eyes: Negative.   Respiratory: Negative.  Negative for shortness of breath.   Cardiovascular: Negative.  Negative for chest pain.  Gastrointestinal:  Negative for abdominal pain, constipation, diarrhea and vomiting.  Genitourinary: Negative.    Musculoskeletal:  Negative for joint pain and myalgias.  Skin: Negative.   Neurological:  Negative for dizziness and headaches.  Endo/Heme/Allergies: Negative.   All other systems reviewed and are negative.      Objective:   BP 105/70   Pulse (!) 107   Ht 5' 4 (1.626 m)   Wt 181 lb 9.6 oz (82.4 kg)   SpO2 98%   BMI 31.17 kg/m   Vitals:   10/04/23 1114  BP: 105/70  Pulse: (!) 107  Height: 5' 4 (1.626 m)  Weight: 181 lb 9.6 oz (82.4 kg)  SpO2: 98%  BMI (Calculated): 31.16    Physical Exam Vitals and nursing note reviewed.  Constitutional:  Appearance: Normal appearance. She is normal weight.  HENT:     Head: Normocephalic and atraumatic.     Nose: Nose normal.     Mouth/Throat:     Mouth: Mucous membranes are moist.   Eyes:     Extraocular Movements: Extraocular movements intact.     Conjunctiva/sclera: Conjunctivae normal.     Pupils: Pupils are equal, round, and reactive to light.    Cardiovascular:     Rate and Rhythm: Normal rate and regular rhythm.     Pulses: Normal pulses.     Heart sounds: Normal heart sounds.  Pulmonary:     Effort: Pulmonary effort is normal.     Breath sounds: Normal breath sounds.  Abdominal:     General: Abdomen is flat. Bowel sounds are normal.     Palpations: Abdomen is soft.   Musculoskeletal:        General: Normal range of motion.     Cervical back: Normal range of motion.   Skin:    General: Skin is warm and dry.   Neurological:     General: No focal deficit present.     Mental Status: She is alert and oriented to person, place, and time.   Psychiatric:        Mood and Affect: Mood normal.        Behavior: Behavior normal.        Thought Content: Thought content normal.        Judgment: Judgment normal.      No results found for any visits on 10/04/23.  No results found for this or any previous visit (from the past 2160 hours).    Assessment & Plan:  Continue same medications.  Problem List  Items Addressed This Visit       Cardiovascular and Mediastinum   Essential hypertension - Primary     Endocrine   Uncontrolled type 2 diabetes mellitus with hyperglycemia, with long-term current use of insulin (HCC)     Other   Current tobacco use   Tobacco dependence    Return in about 4 months (around 02/03/2024).   Total time spent: 25 minutes  [employer], NP  10/04/2023   This document may have been prepared by Dragon Voice Recognition software and as such may include unintentional dictation errors.

## 2023-10-10 ENCOUNTER — Telehealth: Payer: Self-pay

## 2023-10-10 NOTE — Telephone Encounter (Signed)
 Beverly informed and she will send fax over

## 2023-10-10 NOTE — Telephone Encounter (Signed)
 Need to call the group home and have them send a copy of the most recent [facility] or medication list to complete the FL2 for the patient, we told them at the visit we needed this and they were supposed to have this faxed over last week and we still havent received it.

## 2023-10-13 ENCOUNTER — Other Ambulatory Visit: Payer: Self-pay

## 2023-10-13 ENCOUNTER — Other Ambulatory Visit: Payer: Self-pay | Admitting: Internal Medicine

## 2023-10-13 DIAGNOSIS — B353 Tinea pedis: Secondary | ICD-10-CM

## 2023-11-01 ENCOUNTER — Other Ambulatory Visit: Payer: Self-pay | Admitting: Family

## 2023-12-02 ENCOUNTER — Other Ambulatory Visit: Payer: Self-pay

## 2023-12-02 MED ORDER — B COMPLEX VITAMINS PO CAPS: 1.0000 | ORAL_CAPSULE | Freq: Every day | ORAL | 1 refills | Status: DC

## 2023-12-02 MED ORDER — VITAMIN D-3 125 MCG (5000 UT) PO TABS: 5000.0000 [IU] | ORAL_TABLET | Freq: Every day | ORAL | 5 refills | Status: DC

## 2023-12-02 MED ORDER — ONE-DAILY MULTI VITAMINS PO TABS: 1.0000 | ORAL_TABLET | Freq: Every day | ORAL | 1 refills | Status: DC

## 2023-12-14 ENCOUNTER — Other Ambulatory Visit: Payer: Self-pay | Admitting: Cardiology

## 2024-01-10 ENCOUNTER — Other Ambulatory Visit: Payer: Self-pay | Admitting: Cardiology

## 2024-01-10 ENCOUNTER — Other Ambulatory Visit: Payer: Self-pay | Admitting: Family

## 2024-01-25 ENCOUNTER — Other Ambulatory Visit (HOSPITAL_COMMUNITY): Payer: Self-pay | Admitting: Specialist

## 2024-01-25 DIAGNOSIS — Z87891 Personal history of nicotine dependence: Secondary | ICD-10-CM

## 2024-01-25 DIAGNOSIS — F17210 Nicotine dependence, cigarettes, uncomplicated: Secondary | ICD-10-CM

## 2024-02-03 ENCOUNTER — Ambulatory Visit: Admitting: Cardiology

## 2024-02-07 ENCOUNTER — Ambulatory Visit: Admitting: Cardiology

## 2024-02-13 ENCOUNTER — Other Ambulatory Visit: Payer: Self-pay | Admitting: Cardiology

## 2024-02-13 ENCOUNTER — Ambulatory Visit (HOSPITAL_COMMUNITY)
Admission: RE | Admit: 2024-02-13 | Discharge: 2024-02-13 | Disposition: A | Discharge status: Home / Self Care | Source: Ambulatory Visit | Attending: Specialist | Admitting: Specialist

## 2024-02-13 DIAGNOSIS — D500 Iron deficiency anemia secondary to blood loss (chronic): Secondary | ICD-10-CM

## 2024-02-13 DIAGNOSIS — Z87891 Personal history of nicotine dependence: Secondary | ICD-10-CM | POA: Insufficient documentation

## 2024-02-13 DIAGNOSIS — F17210 Nicotine dependence, cigarettes, uncomplicated: Secondary | ICD-10-CM | POA: Insufficient documentation

## 2024-02-21 ENCOUNTER — Ambulatory Visit: Admitting: Cardiology

## 2024-03-19 ENCOUNTER — Other Ambulatory Visit: Payer: Self-pay

## 2024-03-19 DIAGNOSIS — B353 Tinea pedis: Secondary | ICD-10-CM

## 2024-03-20 MED ORDER — CLOTRIMAZOLE 1 % EX CREA: TOPICAL_CREAM | Freq: Two times a day (BID) | CUTANEOUS | 3 refills | Status: AC

## 2024-05-07 ENCOUNTER — Other Ambulatory Visit (HOSPITAL_COMMUNITY): Payer: Self-pay | Admitting: Cardiology

## 2024-05-07 DIAGNOSIS — Z1231 Encounter for screening mammogram for malignant neoplasm of breast: Secondary | ICD-10-CM

## 2024-05-10 ENCOUNTER — Other Ambulatory Visit: Payer: Self-pay | Admitting: Specialist

## 2024-05-10 DIAGNOSIS — R911 Solitary pulmonary nodule: Secondary | ICD-10-CM

## 2024-05-15 ENCOUNTER — Other Ambulatory Visit: Payer: Self-pay

## 2024-05-15 MED ORDER — B COMPLEX VITAMINS PO CAPS: 1.0000 | ORAL_CAPSULE | Freq: Every day | ORAL | 1 refills | Status: AC

## 2024-05-15 MED ORDER — ONE-DAILY MULTI VITAMINS PO TABS: 1.0000 | ORAL_TABLET | Freq: Every day | ORAL | 1 refills | Status: AC

## 2024-05-15 MED ORDER — VITAMIN D-3 125 MCG (5000 UT) PO TABS: 5000.0000 [IU] | ORAL_TABLET | Freq: Every day | ORAL | 5 refills | Status: AC

## 2024-06-07 ENCOUNTER — Ambulatory Visit

## 2025-01-21 ENCOUNTER — Ambulatory Visit (HOSPITAL_COMMUNITY)
